# Patient Record
Sex: Female | Born: 1990 | Race: Black or African American | Hispanic: No | Smoking: Never smoker
Health system: Southern US, Community
[De-identification: ages and names within clinical notes are randomized; demographics above are authoritative.]

## PROBLEM LIST (undated history)

## (undated) ENCOUNTER — Inpatient Hospital Stay (HOSPITAL_COMMUNITY): Payer: Self-pay

## (undated) DIAGNOSIS — R519 Headache, unspecified: Secondary | ICD-10-CM

## (undated) DIAGNOSIS — Z789 Other specified health status: Secondary | ICD-10-CM

## (undated) DIAGNOSIS — D649 Anemia, unspecified: Secondary | ICD-10-CM

## (undated) HISTORY — PX: NO PAST SURGERIES: SHX2092

## (undated) HISTORY — DX: Other specified health status: Z78.9

## (undated) HISTORY — DX: Headache, unspecified: R51.9

---

## 2014-03-30 ENCOUNTER — Ambulatory Visit: Payer: Self-pay

## 2014-05-19 ENCOUNTER — Encounter: Payer: Self-pay | Admitting: Family Medicine

## 2014-05-19 ENCOUNTER — Ambulatory Visit (INDEPENDENT_AMBULATORY_CARE_PROVIDER_SITE_OTHER): Payer: Medicaid Other | Admitting: Family Medicine

## 2014-05-19 VITALS — BP 114/63 | HR 88 | Temp 98.6°F | Ht 66.0 in | Wt 194.5 lb

## 2014-05-19 DIAGNOSIS — Z008 Encounter for other general examination: Secondary | ICD-10-CM

## 2014-05-19 DIAGNOSIS — Z23 Encounter for immunization: Secondary | ICD-10-CM

## 2014-05-19 DIAGNOSIS — Z3049 Encounter for surveillance of other contraceptives: Secondary | ICD-10-CM

## 2014-05-19 DIAGNOSIS — M25512 Pain in left shoulder: Secondary | ICD-10-CM

## 2014-05-19 DIAGNOSIS — Z0289 Encounter for other administrative examinations: Secondary | ICD-10-CM

## 2014-05-19 MED ORDER — IBUPROFEN 200 MG PO TABS: 200.0000 mg | ORAL_TABLET | Freq: Four times a day (QID) | ORAL | Status: DC | PRN

## 2014-05-19 MED ORDER — CYCLOBENZAPRINE HCL 5 MG PO TABS: 5.0000 mg | ORAL_TABLET | Freq: Three times a day (TID) | ORAL | Status: DC | PRN

## 2014-05-19 NOTE — Patient Instructions (Signed)
Make an appt for a nurse visit for Depo shot in early January.  Come back in the summer and we will check on you.  Otherwise, you look very healthy.    It was good to see you today.

## 2014-05-21 ENCOUNTER — Encounter: Payer: Self-pay | Admitting: Family Medicine

## 2014-05-21 DIAGNOSIS — Z309 Encounter for contraceptive management, unspecified: Secondary | ICD-10-CM | POA: Insufficient documentation

## 2014-05-21 DIAGNOSIS — Z0289 Encounter for other administrative examinations: Secondary | ICD-10-CM | POA: Insufficient documentation

## 2014-05-21 DIAGNOSIS — M25512 Pain in left shoulder: Secondary | ICD-10-CM | POA: Insufficient documentation

## 2014-05-21 NOTE — Assessment & Plan Note (Signed)
Appears to be muscle spasm of Left neck/trapezius. OTC advil and flexeril PRN. FU if no improvement in 2 weeks, sooner if worsening.

## 2014-05-21 NOTE — Progress Notes (Signed)
[facility] Patient Visit  HPI:  Patient presents to [facility] today for a new patient appointment to establish general primary care.  She also wishes to discuss her shoulder pain.  States that her shoulder pain started about 2 weeks ago.    ROS: See HPI  Immigrant Social History: - Date arrived in [place]: Sept 2, 2015.  She is originally from [facility] [place] and fled to refugee camp in [place] age 23 in 2000.  Has lived there for past 14 years.  Met husband there, children born there.  - Language: Speaks English. Also speaks [place] and [place]. - Education: Highest level of education: no formal education - Tobacco/alcohol/drug use: denies - Marriage Status: married - Sexual activity: vaginal - Class A/B conditions: none - denies history of torture.   Past Medical Hx:  -[id number].  Otherwise negative medical history  Past Surgical Hx:  - denies  PHYSICAL EXAM: BP 114/63 mmHg  Pulse 88  Temp(Src) 98.6 F (37 C) (Oral)  Ht '5\' 6"'  (1.676 m)  Wt 194 lb 8 oz (88.225 kg)  BMI 31.41 kg/m2 BP 114/63 mmHg  Pulse 88  Temp(Src) 98.6 F (37 C) (Oral)  Ht '5\' 6"'  (1.676 m)  Wt 194 lb 8 oz (88.225 kg)  BMI 31.41 kg/m2 Gen: Well NAD HEENT: EOMI,  MMM Lungs: CTABL Nl WOB Heart: RRR no MRG Abd: NABS, NT, ND MSK:  Left shoulder with full AROM and PROM.  Tender to palpation along Left trapezius muscle, tight here as well.  No redness or swelling to shoulder.  Negative impingement testing.  Spurling negative.  Exts: Non edematous BL  LE, warm and well perfused.  Psych:  Not depressed or anxious appearing.  Linear and coherent thought process as evidenced by speech pattern. Smiles spontaneously.

## 2014-05-21 NOTE — Assessment & Plan Note (Signed)
Received Depo while in [place].  Due in Jan.  She will call and make an appt for this.

## 2014-05-27 ENCOUNTER — Encounter: Payer: Self-pay | Admitting: Family Medicine

## 2014-05-27 ENCOUNTER — Ambulatory Visit (INDEPENDENT_AMBULATORY_CARE_PROVIDER_SITE_OTHER): Payer: Medicaid Other | Admitting: Family Medicine

## 2014-05-27 VITALS — BP 109/72 | HR 124 | Temp 100.1°F | Wt 187.0 lb

## 2014-05-27 DIAGNOSIS — J029 Acute pharyngitis, unspecified: Secondary | ICD-10-CM | POA: Insufficient documentation

## 2014-05-27 MED ORDER — AMOXICILLIN 500 MG PO CAPS: 500.0000 mg | ORAL_CAPSULE | Freq: Three times a day (TID) | ORAL | Status: DC

## 2014-05-27 NOTE — Assessment & Plan Note (Signed)
Very likely strep with fever and adenopathy and age.  Will treat with antibiotics and nsaids

## 2014-05-27 NOTE — Patient Instructions (Signed)
Good to see you today!  Thanks for coming in.  You have a throat infection  Take the amoxicillin antibiotics three times daily until all gone  Use salt water gargles three times daily for your throat  Take ibuprofen 200 mg 2-3 tablets three times daily as needed for pain and fever  If you are feeling worse or can not drink fluids or are not better in one week come back

## 2014-05-27 NOTE — Progress Notes (Signed)
   Subjective:    Patient ID: Christine IvanElinda Hodge, female    DOB: 11/24/1990, 23 y.o.   MRN: [id number]  HPI URI  Has been sick for 1 days. Nasal discharge: mild Medications tried: tylenol Sick contacts: ?  Symptoms Fever: yes up to 103 yesterday Headache or face pain: headache no face pain Tooth pain: no Sneezing: no Scratchy throat: Sore throat painful to swallow, no drooling nonfocal Allergies: no Muscle aches: yes Severe fatigue Stiff neck: no Shortness of breath: no Rash: no Sore throat or swollen glands: Yes  Chief Complaint noted Review of Symptoms - see HPI PMH - Smoking status noted.   Vital Signs reviewed   Review of Systems     Objective:   Physical Exam  Tired but conversant and interactive Throat - red with bilateral swollen tonsils.  No trismus or drooling or uvula deviation.  No exudate.  Moderate bilateral anter cerv adennopathy Ears:  External ear exam shows no significant lesions or deformities.  Otoscopic examination reveals clear canals, tympanic membranes are intact bilaterally without bulging, retraction, inflammation or discharge. Hearing is grossly normal bilaterall Lungs:  Normal respiratory effort, chest expands symmetrically. Lungs are clear to auscultation, no crackles or wheezes. Heart - Regular rate and rhythm.  No murmurs, gallops or rubs.    Skin:  Intact without suspicious lesions or rashes Neck:    Supple with full range of motion without pain.       Assessment & Plan:

## 2014-06-09 ENCOUNTER — Ambulatory Visit: Payer: Medicaid Other

## 2014-08-12 ENCOUNTER — Emergency Department (HOSPITAL_COMMUNITY)
Admission: EM | Admit: 2014-08-12 | Discharge: 2014-08-12 | Disposition: A | Discharge status: Home / Self Care | Payer: Medicaid Other | Attending: Emergency Medicine | Admitting: Emergency Medicine

## 2014-08-12 ENCOUNTER — Emergency Department (HOSPITAL_COMMUNITY): Payer: Medicaid Other

## 2014-08-12 ENCOUNTER — Encounter (HOSPITAL_COMMUNITY): Payer: Self-pay | Admitting: Emergency Medicine

## 2014-08-12 DIAGNOSIS — M546 Pain in thoracic spine: Secondary | ICD-10-CM | POA: Diagnosis not present

## 2014-08-12 DIAGNOSIS — M549 Dorsalgia, unspecified: Secondary | ICD-10-CM

## 2014-08-12 DIAGNOSIS — Z792 Long term (current) use of antibiotics: Secondary | ICD-10-CM | POA: Diagnosis not present

## 2014-08-12 DIAGNOSIS — R0789 Other chest pain: Secondary | ICD-10-CM | POA: Insufficient documentation

## 2014-08-12 DIAGNOSIS — Z79899 Other long term (current) drug therapy: Secondary | ICD-10-CM | POA: Insufficient documentation

## 2014-08-12 DIAGNOSIS — R079 Chest pain, unspecified: Secondary | ICD-10-CM | POA: Diagnosis present

## 2014-08-12 LAB — CBC WITH DIFFERENTIAL/PLATELET
BASOS PCT: 0 % (ref 0–1)
Basophils Absolute: 0 10*3/uL (ref 0.0–0.1)
Eosinophils Absolute: 0.3 10*3/uL (ref 0.0–0.7)
Eosinophils Relative: 5 % (ref 0–5)
HEMATOCRIT: 38 % (ref 36.0–46.0)
HEMOGLOBIN: 12.5 g/dL (ref 12.0–15.0)
Lymphocytes Relative: 49 % — ABNORMAL HIGH (ref 12–46)
Lymphs Abs: 2.7 10*3/uL (ref 0.7–4.0)
MCH: 26.4 pg (ref 26.0–34.0)
MCHC: 32.9 g/dL (ref 30.0–36.0)
MCV: 80.2 fL (ref 78.0–100.0)
Monocytes Absolute: 0.3 10*3/uL (ref 0.1–1.0)
Monocytes Relative: 5 % (ref 3–12)
NEUTROS PCT: 41 % — AB (ref 43–77)
Neutro Abs: 2.3 10*3/uL (ref 1.7–7.7)
Platelets: 221 10*3/uL (ref 150–400)
RBC: 4.74 MIL/uL (ref 3.87–5.11)
RDW: 14.4 % (ref 11.5–15.5)
WBC: 5.5 10*3/uL (ref 4.0–10.5)

## 2014-08-12 LAB — BASIC METABOLIC PANEL
Anion gap: 6 (ref 5–15)
BUN: 10 mg/dL (ref 6–23)
CO2: 27 mmol/L (ref 19–32)
Calcium: 9.5 mg/dL (ref 8.4–10.5)
Chloride: 106 mmol/L (ref 96–112)
Creatinine, Ser: 0.55 mg/dL (ref 0.50–1.10)
GFR calc Af Amer: >90 mL/min (ref >90)
Glucose, Bld: 93 mg/dL (ref 70–99)
Potassium: 3.7 mmol/L (ref 3.5–5.1)
SODIUM: 139 mmol/L (ref 135–145)

## 2014-08-12 LAB — I-STAT BETA HCG BLOOD, ED (MC, WL, AP ONLY): I-stat hCG, quantitative: <5 m[IU]/mL (ref <5)

## 2014-08-12 LAB — I-STAT TROPONIN, ED: Troponin i, poc: 0 ng/mL (ref 0.00–0.08)

## 2014-08-12 MED ORDER — IBUPROFEN 800 MG PO TABS: 800.0000 mg | ORAL_TABLET | Freq: Three times a day (TID) | ORAL | Status: DC

## 2014-08-12 MED ORDER — METHOCARBAMOL 500 MG PO TABS
500.0000 mg | ORAL_TABLET | Freq: Once | ORAL | Status: DC
  Filled 2014-08-12: qty 1

## 2014-08-12 MED ORDER — METHOCARBAMOL 500 MG PO TABS: 500.0000 mg | ORAL_TABLET | Freq: Two times a day (BID) | ORAL | Status: DC

## 2014-08-12 MED ORDER — KETOROLAC TROMETHAMINE 30 MG/ML IJ SOLN
30.0000 mg | Freq: Once | INTRAMUSCULAR | Status: AC
  Administered 2014-08-12: 30 mg via INTRAMUSCULAR
  Filled 2014-08-12: qty 1

## 2014-08-12 NOTE — ED Provider Notes (Signed)
CSN: [id number]     Arrival date & time 08/12/14  1244 History   First MD Initiated Contact with Patient 08/12/14 1527     Chief Complaint  Patient presents with  . Chest Pain  . Back Pain     (Consider location/radiation/quality/duration/timing/severity/associated sxs/prior Treatment) HPI Comments: Patient presents today with a chief complaint of chest pain and back pain.  She states she fell down the stairs one month ago and hit her chest and her back.  However, she did not begin having pain until 2 weeks ago.  She states that she does have a one year old and a three year old child at home, which she does lift frequently.  Pain is worse with movement and also palpation.  Pain located left upper back and also left anterior chest and does not radiate.  She tried taking an OTC muscle rub cream for the pain, which gave her mild relief.  She denies SOB, cough, fever, dizziness, syncope, numbness, or tingling.  She reports that she had one episode of vomiting earlier today, but denies nausea or vomiting at this time.  She denies abdominal pain..  No history of DVT or PE.  No exogenous estrogen use.  No LE edema or pain.  No prolonged travel or surgeries in the past 4 weeks.  No prior cardiac history.  The history is provided by the patient.    History reviewed. No pertinent past medical history. History reviewed. No pertinent past surgical history. History reviewed. No pertinent family history. History  Substance Use Topics  . Smoking status: Never Smoker   . Smokeless tobacco: Not on file  . Alcohol Use: No   OB History    No data available     Review of Systems  All other systems reviewed and are negative.     Allergies  Review of patient's allergies indicates no known allergies.  Home Medications   Prior to Admission medications   Medication Sig Start Date End Date Taking? Authorizing Provider  amoxicillin (AMOXIL) 500 MG capsule Take 1 capsule (500 mg total) by mouth 3  (three) times daily. 05/27/14   Carney Living, MD  cyclobenzaprine (FLEXERIL) 5 MG tablet Take 1 tablet (5 mg total) by mouth 3 (three) times daily as needed for muscle spasms. 05/19/14   Tobey Grim, MD  ibuprofen (ADVIL) 200 MG tablet Take 1 tablet (200 mg total) by mouth every 6 (six) hours as needed. 05/19/14   Tobey Grim, MD   BP 134/78 mmHg  Pulse 75  Temp(Src) 98.3 F (36.8 C) (Oral)  Resp 18  SpO2 100% Physical Exam  Constitutional: She appears well-developed and well-nourished.  HENT:  Head: Normocephalic and atraumatic.  Mouth/Throat: Oropharynx is clear and moist.  Neck: Normal range of motion. Neck supple.  Cardiovascular: Normal rate, regular rhythm and normal heart sounds.   Pulses:      Dorsalis pedis pulses are 2+ on the right side, and 2+ on the left side.  Pulmonary/Chest: Effort normal and breath sounds normal. No respiratory distress. She has no wheezes. She has no rales. She exhibits tenderness.  Abdominal: Soft. Bowel sounds are normal. She exhibits no distension and no mass. There is no tenderness. There is no rebound and no guarding.  Musculoskeletal: Normal range of motion.  Pain of the chest when raising the left arm No LE erythema or edema bilaterally  Neurological: She is alert.  Skin: Skin is warm and dry.  Psychiatric: She has a normal mood  and affect.  Nursing note and vitals reviewed.   ED Course  Procedures (including critical care time) Labs Review Labs Reviewed  CBC WITH DIFFERENTIAL/PLATELET - Abnormal; Notable for the following:    Neutrophils Relative % 41 (*)    Lymphocytes Relative 49 (*)    All other components within normal limits  BASIC METABOLIC PANEL  I-STAT TROPOININ, ED  I-STAT BETA HCG BLOOD, ED ([facility], [facility], [facility] ONLY)    Imaging Review Dg Chest 2 View  08/12/2014   CLINICAL DATA:  Left upper chest pain starting yesterday, back pain  EXAM: CHEST  2 VIEW  COMPARISON:  None.  FINDINGS: Cardiomediastinal silhouette  is unremarkable. No acute infiltrate or pleural effusion. No pulmonary edema. Bony thorax is unremarkable.  IMPRESSION: No active cardiopulmonary disease.   Electronically Signed   By: Natasha MeadLiviu  Pop M.D.   On: 08/12/2014 16:24     EKG Interpretation   Date/Time:  Wednesday August 12 2014 13:14:53 EST Ventricular Rate:  75 PR Interval:  138 QRS Duration: 72 QT Interval:  384 QTC Calculation: 428 R Axis:   28 Text Interpretation:  Normal sinus rhythm Normal ECG No old tracing to  compare Confirmed by Mirian MoGentry, Matthew [phone] on 08/12/2014 4:37:13 PM      MDM   Final diagnoses:  None   Patient is a 24 year old female who presents today with a chief complaint of chest pain.  Chest pain has been present for the past 2 weeks.  Chest wall tender to palpation.   Chest pain also worse when raising her left arm.  Suspect that the pain is musculoskeletal.  She is PERC negative.  No ischemic changes on EKG.  Troponin negative.  CXR negative.   Pain improved after given Toradol.  Feel that the patient is stable for discharge.  She states that she has an appointment with PCP scheduled tomorrow.  Return precautions given.    Santiago GladHeather Morgane Joerger, PA-C 08/13/14 2200  Vanetta MuldersScott Zackowski, MD 08/20/14 0002

## 2014-08-12 NOTE — ED Notes (Signed)
States 1 mth ago she fell down the stairs.

## 2014-08-12 NOTE — Discharge Instructions (Signed)
Return to the Emergency Department if symptoms change or worsen.

## 2014-08-12 NOTE — ED Notes (Signed)
Pt c/o left sided CP, back pain and arm pain x 2 days worse with movement; pt denies obvious injury

## 2014-08-13 ENCOUNTER — Ambulatory Visit: Payer: Medicaid Other | Admitting: Family Medicine

## 2014-08-21 ENCOUNTER — Ambulatory Visit: Payer: Medicaid Other | Admitting: Family Medicine

## 2014-08-25 ENCOUNTER — Encounter: Payer: Self-pay | Admitting: Family Medicine

## 2014-08-25 ENCOUNTER — Ambulatory Visit (INDEPENDENT_AMBULATORY_CARE_PROVIDER_SITE_OTHER): Payer: Medicaid Other | Admitting: Family Medicine

## 2014-08-25 VITALS — BP 105/70 | HR 87 | Temp 97.7°F | Ht 66.0 in | Wt 187.3 lb

## 2014-08-25 DIAGNOSIS — Z309 Encounter for contraceptive management, unspecified: Secondary | ICD-10-CM

## 2014-08-25 DIAGNOSIS — M25512 Pain in left shoulder: Secondary | ICD-10-CM

## 2014-08-25 LAB — POCT URINE PREGNANCY: PREG TEST UR: NEGATIVE

## 2014-08-25 MED ORDER — MEDROXYPROGESTERONE ACETATE 150 MG/ML IM SUSP
150.0000 mg | Freq: Once | INTRAMUSCULAR | Status: AC
  Administered 2014-08-25: 150 mg via INTRAMUSCULAR

## 2014-08-25 MED ORDER — DICLOFENAC SODIUM 1 % TD GEL: 2.0000 g | Freq: Four times a day (QID) | TRANSDERMAL | Status: DC

## 2014-08-25 MED ORDER — TRAMADOL HCL 50 MG PO TABS: 50.0000 mg | ORAL_TABLET | Freq: Three times a day (TID) | ORAL | Status: DC | PRN

## 2014-08-25 NOTE — Patient Instructions (Signed)
Get the xrays at the hospital.  Ask for the radiology department.  You do NOT need an appointment.    You wll be called by sports medicine for your shoulder.  Pregnancy test and Depo injection today.  Take the Tramadol every 8 hours for pain.    Use the Voltaren cream on your shoulder.

## 2014-08-26 NOTE — Progress Notes (Signed)
Subjective:    Christine Hodge is a 24 y.o. female who [facility] for Left shoulder pain:  1.  Left shoulder pain:  Patient fell about 1 month ago.  Landed on her shoulder, NOT outstretched hand.  Has been seen for shoulder pain at [facility], did not recall fall.  States pain is worse when she lays on that side at night.  Worse when she tries to raise her arm above her head.  No bruising or swelling that she's noted.  Has not known what to try for relief.    ROS as above per HPI, otherwise neg.   No fevers or chills.  No numbness or tingling in arm.   The following portions of the patient's history were reviewed and updated as appropriate: allergies, current medications, past medical history, family and social history, and problem list. Patient is a nonsmoker.    PMH reviewed.  No past medical history on file. No past surgical history on file.  Medications reviewed. Current Outpatient Prescriptions  Medication Sig Dispense Refill  . cyclobenzaprine (FLEXERIL) 5 MG tablet Take 1 tablet (5 mg total) by mouth 3 (three) times daily as needed for muscle spasms. 30 tablet 1  . diclofenac sodium (VOLTAREN) 1 % GEL Apply 2 g topically 4 (four) times daily. 100 g 1  . ibuprofen (ADVIL,MOTRIN) 800 MG tablet Take 1 tablet (800 mg total) by mouth 3 (three) times daily. 21 tablet 0  . methocarbamol (ROBAXIN) 500 MG tablet Take 1 tablet (500 mg total) by mouth 2 (two) times daily. 20 tablet 0  . traMADol (ULTRAM) 50 MG tablet Take 1 tablet (50 mg total) by mouth every 8 (eight) hours as needed. 30 tablet 0   No current facility-administered medications for this visit.     Objective:   Physical Exam BP 105/70 mmHg  Pulse 87  Temp(Src) 97.7 F (36.5 C) (Oral)  Ht 5\' 6"  (1.676 m)  Wt 187 lb 4.8 oz (84.959 kg)  BMI 30.25 kg/m2  LMP 06/19/2014 Gen:  Alert, cooperative patient who appears stated age in no acute distress.  Vital signs reviewed. HEENT: EOMI,  MMM MSK:  Right shoulder WNL.    Left shoulder:  Normal appearing to inspection.  No bruising noted.  TTP at [facility] and [facility] joint.  I cannot tell, but it seems that there might be the slightest dislocation of [place] joint as this is more predominant than Right [place] joint.  Tender to palpation here.  Internal and external rotation limited by pain.  Can raise arm to about 90 degrees active ROM in both forward and lateral planes and has pain trying to lift any higher.  Better with passive ROM.  TTP along left acromion without stepoff.  She has tenderness and palpable muscle spasm along trapezius and paraspinal muscles to mid-thoracic region.   Results for orders placed or performed in visit on 08/25/14 (from the past 72 hour(s))  POCT urine pregnancy     Status: None   Collection Time: 08/25/14 12:20 PM  Result Value Ref Range   Preg Test, Ur Negative

## 2014-08-26 NOTE — Assessment & Plan Note (Signed)
Pregnancy test and Depo today.

## 2014-08-26 NOTE — Assessment & Plan Note (Signed)
This is different pain than prior in December as she has fallen since then.   I am worried about rotator cuff pathology -- but she also may have mild [place] anterior dislocation.   Sending for xrays of shoulder to assess.  Does not appear to have direct fracture of shoulder/humerus from exam. Sending to sports medicine for ultrasound.  She has Ibuprofen at home. Tramadol for severe pain relief.  Attempt voltaren gel.  Muscle relaxer.  Heat.  Range of motion exercises.  Will call with results of radiographs.

## 2014-08-27 ENCOUNTER — Ambulatory Visit
Admission: RE | Admit: 2014-08-27 | Discharge: 2014-08-27 | Disposition: A | Discharge status: Home / Self Care | Payer: Medicaid Other | Source: Ambulatory Visit | Attending: Family Medicine | Admitting: Family Medicine

## 2014-08-27 ENCOUNTER — Telehealth: Payer: Self-pay | Admitting: Family Medicine

## 2014-08-27 DIAGNOSIS — M25512 Pain in left shoulder: Secondary | ICD-10-CM

## 2014-08-27 NOTE — Telephone Encounter (Signed)
Please call patient and let her know that her xray is completely normal.  No broken bones.  She should be hearing from Sports medicine soon for follow-up.  I can answer any questions when I return.  She speaks [place].

## 2014-09-02 NOTE — Telephone Encounter (Signed)
Spoke with pt and informed her of below. Zimmerman Rumple, Kalif Kattner D  

## 2014-09-08 ENCOUNTER — Ambulatory Visit: Payer: Medicaid Other | Admitting: Sports Medicine

## 2014-11-11 ENCOUNTER — Ambulatory Visit (INDEPENDENT_AMBULATORY_CARE_PROVIDER_SITE_OTHER): Payer: Medicaid Other | Admitting: *Deleted

## 2014-11-11 ENCOUNTER — Ambulatory Visit: Payer: Medicaid Other

## 2014-11-11 DIAGNOSIS — Z3042 Encounter for surveillance of injectable contraceptive: Secondary | ICD-10-CM

## 2014-11-11 MED ORDER — MEDROXYPROGESTERONE ACETATE 150 MG/ML IM SUSP
150.0000 mg | Freq: Once | INTRAMUSCULAR | Status: AC
  Administered 2014-11-11: 150 mg via INTRAMUSCULAR

## 2014-11-11 NOTE — Progress Notes (Signed)
   Pt in for Depo Provera injection.  Pt tolerated Depo injection. Depo given left upper outer quadrant.  Next injection due August 24-Sept 7, 2016.  Reminder card given. Clovis PuMartin, Tamika L, RN

## 2014-11-18 ENCOUNTER — Ambulatory Visit: Payer: Medicaid Other | Admitting: Family Medicine

## 2014-11-25 ENCOUNTER — Other Ambulatory Visit (HOSPITAL_COMMUNITY)
Admission: RE | Admit: 2014-11-25 | Discharge: 2014-11-25 | Disposition: A | Discharge status: Home / Self Care | Payer: Medicaid Other | Source: Ambulatory Visit | Attending: Family Medicine | Admitting: Family Medicine

## 2014-11-25 ENCOUNTER — Encounter: Payer: Self-pay | Admitting: Family Medicine

## 2014-11-25 ENCOUNTER — Ambulatory Visit (INDEPENDENT_AMBULATORY_CARE_PROVIDER_SITE_OTHER): Payer: Medicaid Other | Admitting: Family Medicine

## 2014-11-25 VITALS — BP 111/69 | HR 78 | Temp 97.9°F | Wt 180.1 lb

## 2014-11-25 DIAGNOSIS — M25512 Pain in left shoulder: Secondary | ICD-10-CM

## 2014-11-25 DIAGNOSIS — B3731 Acute candidiasis of vulva and vagina: Secondary | ICD-10-CM

## 2014-11-25 DIAGNOSIS — M12512 Traumatic arthropathy, left shoulder: Secondary | ICD-10-CM | POA: Diagnosis present

## 2014-11-25 DIAGNOSIS — M75102 Unspecified rotator cuff tear or rupture of left shoulder, not specified as traumatic: Principal | ICD-10-CM

## 2014-11-25 DIAGNOSIS — B373 Candidiasis of vulva and vagina: Secondary | ICD-10-CM

## 2014-11-25 DIAGNOSIS — Z202 Contact with and (suspected) exposure to infections with a predominantly sexual mode of transmission: Secondary | ICD-10-CM | POA: Diagnosis not present

## 2014-11-25 DIAGNOSIS — M12812 Other specific arthropathies, not elsewhere classified, left shoulder: Secondary | ICD-10-CM

## 2014-11-25 DIAGNOSIS — Z113 Encounter for screening for infections with a predominantly sexual mode of transmission: Secondary | ICD-10-CM | POA: Insufficient documentation

## 2014-11-25 MED ORDER — FLUCONAZOLE 150 MG PO TABS: 150.0000 mg | ORAL_TABLET | Freq: Once | ORAL | Status: DC

## 2014-11-25 MED ORDER — TRAMADOL HCL 50 MG PO TABS: 50.0000 mg | ORAL_TABLET | Freq: Three times a day (TID) | ORAL | Status: DC | PRN

## 2014-11-25 MED ORDER — MELOXICAM 15 MG PO TABS: ORAL_TABLET | ORAL | Status: DC

## 2014-11-25 MED ORDER — DICLOFENAC SODIUM 1 % TD GEL: 2.0000 g | Freq: Four times a day (QID) | TRANSDERMAL | Status: DC

## 2014-11-25 NOTE — Patient Instructions (Addendum)
Thank you for coming in, today!  I think you have some inflammation in the muscles that control your shoulder joint. These muscles are called the "rotator cuff." I want you to take a medication called meloxicam, every day for about 2 weeks. Take it with food. DO NOT take ibuprofen, Advil, Motrin, Aleve, etc with meloxicam. You CAN take Tylenol with it. You can also take tramadol for pain, up to 3 times per day -- this is the same pain medication you had, before. I will show you some exercises to do for your shoulder. Start with once per day and work your way up to about 3 times per day.  I do think you have a yeast infection. This is NOT sexually transmitted. I want you to take a medication called fluconazole, one pill one time. You will get 2 pills. You can take the second pill in 1 week if you need.  I will test you for HIV, syphilis, gonorrhea, and Chlamydia, today (these are all sexually-transmitted infections). If anything is positive, someone will call you and tell you what you need to do. If everything is negative, I will write you a letter.  Come back to see Dr. Gwendolyn Grant as you need. If your shoulder pain is no better or if it gets worse, come back sometime in the next 4 weeks.  Please feel free to call with any questions or concerns at any time, at [phone]. --Dr. Casper Harrison

## 2014-11-25 NOTE — Progress Notes (Signed)
Subjective:    Patient ID: Christine Hodge, female    DOB: September 02, 1990, 24 y.o.   MRN: [id number]  Visit conducted in Portugese. In-person interpretation provided by Mathis Fare (Language Resources).  HPI: Pt presents to clinic for left shoulder / arm / hand pain for about 2 months. She fell on her shoulder at home (down the stairs) and her pain has been variable, sometimes worse than others. Pain is described as worse with some movements at times (raising arm), but not always. She was seen for this back in March and was prescribed Voltaren gel and Tramadol, which helped. She was referred to sports medicine but was unable to get an appointment, there. She denies frank paresthesias or weakness in her arm or hand.  Of note, pt does request HIV testing and STI's. She states this is because she has not had it checked since about 5 months ago; she is currently sexually active but has no symptoms currently and has never had any STI's in the past. She did have some vaginal itching / discomfort about 2-3 weeks ago, but it improved on its own.  Review of Systems: As above.     Objective:   Physical Exam BP 111/69 mmHg  Pulse 78  Temp(Src) 97.9 F (36.6 C)  Wt 180 lb 1.6 oz (81.693 kg) Gen: well-appearing adult female in NAD HEENT: [place]/AT, EOMI, PERRLA, MMM Cardio: RRR, no murmur appreciated Pulm: CTAB, no wheezes, normal WOB Abd: soft, nontender, BS+ Ext: warm, well-perfused, no LE edema MSK: bilateral shoulders normal to inspection  Diffuse soft-tissue tenderness of left shoulder, especially over supraspinatus area  Minimal tenderness in anterior shoulder over bony prominences  ROM full in both shoulders, though slightly slowed on the left secondary to pain  Painful arch with flexion / abduction of left shoulder starting at about 90 degrees of flexion and about 100 degrees of abduction  Increased pain with active resisted internal and external rotation at the left shoulder, external  slightly more than internal  Positive empty can test and Neer's test for impingement on the left Neurovascular: alert, oriented, no gross sensory deficit  Strength 5/5 in both upper extremities, though with increased pain as above in specific movements as noted  Distal pulses in UE intact, bilaterally  GU: normal external vaginal / vulvar structures, no rashes / ulcers / other lesions Speculum: moderate amount of thick, whitish discharge noted without odor; no cervical friability or bleeding noted     Assessment & Plan:  24yo female with likely left rotator cuff pathology as well as concern for STI exposure and exam consistent with current yeast vaginitis  1. Left rotator cuff pathology - Rx for Mobic daily for 2 weeks, then daily PRN (advised taking with food and avoiding other NSAIDs) - Rx's refilled for tramadol and Voltaren gel for pain, otherwise - provided handout on rotator cuff ROM and strengthening exercises and demonstrated; provided Theraband for exercises - advised f/u with Dr. Gwendolyn Grant in 4-6 weeks if no improvement; could consider re-referral to sports medicine, as well  2. STI exposure - no active symptoms other than some itching / burning consistent with yeast (see below) - sample collected for GC / Chlamydia today - blood drawn for HIV and RPR, as well - counseled on safe sex practices - advised f/u as needed; specific recommendations pending test results, otherwise  3. Yeast vaginitis - typical history and exam findings - Rx for Diflucan 150 mg one time to be repeated in 1 week if needed -  f/u as needed, otherwise  Bobbye Morton, MD PGY-3, [facility] 11/25/2014, 12:10 PM

## 2014-11-26 LAB — CERVICOVAGINAL ANCILLARY ONLY
Chlamydia: NEGATIVE
Neisseria Gonorrhea: NEGATIVE

## 2014-11-26 LAB — RPR

## 2014-11-26 LAB — HIV ANTIBODY (ROUTINE TESTING W REFLEX): HIV 1&2 Ab, 4th Generation: NONREACTIVE

## 2014-11-27 ENCOUNTER — Encounter: Payer: Self-pay | Admitting: Family Medicine

## 2015-01-05 ENCOUNTER — Telehealth: Payer: Self-pay | Admitting: Family Medicine

## 2015-01-05 NOTE — Telephone Encounter (Signed)
Would like to talk to dr-has some questions. Had some blood after having sex. Not her period Please advise

## 2015-01-08 ENCOUNTER — Ambulatory Visit: Payer: Medicaid Other | Admitting: Family Medicine

## 2015-01-08 NOTE — Telephone Encounter (Signed)
Attempted to call.  No answer and no machine.  Draco Malczewski Dawn, CMA  

## 2015-03-02 ENCOUNTER — Ambulatory Visit (INDEPENDENT_AMBULATORY_CARE_PROVIDER_SITE_OTHER): Payer: BLUE CROSS/BLUE SHIELD | Admitting: Family Medicine

## 2015-03-02 ENCOUNTER — Other Ambulatory Visit (HOSPITAL_COMMUNITY)
Admission: RE | Admit: 2015-03-02 | Discharge: 2015-03-02 | Disposition: A | Discharge status: Home / Self Care | Payer: BLUE CROSS/BLUE SHIELD | Source: Ambulatory Visit | Attending: Family Medicine | Admitting: Family Medicine

## 2015-03-02 VITALS — BP 104/70 | HR 74 | Temp 97.6°F | Wt 160.0 lb

## 2015-03-02 DIAGNOSIS — M25512 Pain in left shoulder: Secondary | ICD-10-CM | POA: Diagnosis not present

## 2015-03-02 DIAGNOSIS — M7502 Adhesive capsulitis of left shoulder: Secondary | ICD-10-CM

## 2015-03-02 DIAGNOSIS — N898 Other specified noninflammatory disorders of vagina: Secondary | ICD-10-CM | POA: Diagnosis not present

## 2015-03-02 DIAGNOSIS — Z113 Encounter for screening for infections with a predominantly sexual mode of transmission: Secondary | ICD-10-CM | POA: Insufficient documentation

## 2015-03-02 LAB — POCT WET PREP (WET MOUNT): Clue Cells Wet Prep Whiff POC: NEGATIVE

## 2015-03-02 MED ORDER — MELOXICAM 15 MG PO TABS
ORAL_TABLET | ORAL | Status: DC
Start: 2015-03-02 — End: 2017-01-01

## 2015-03-02 MED ORDER — DICLOFENAC SODIUM 1 % TD GEL: 2.0000 g | Freq: Four times a day (QID) | TRANSDERMAL | Status: DC

## 2015-03-02 NOTE — Patient Instructions (Addendum)
Please follow up with Dr Gwendolyn Grant as needed.  I have placed a referral for Sports Medicine for your shoulder for you.  Your prescriptions for Mobic and Voltaren gel have also been renewed.  Continue to use these medications until your are seen at the Sports Medicine clinic.  You have also been provided a work note through Monday 03/08/15.  Once your lab tests come back, we will contact you with results.  Dr Nadine Counts  Rotator Cuff Injury Rotator cuff injury is any type of injury to the set of muscles and tendons that make up the stabilizing unit of your shoulder. This unit holds the ball of your upper arm bone (humerus) in the socket of your shoulder blade (scapula).  CAUSES Injuries to your rotator cuff most commonly come from sports or activities that cause your arm to be moved repeatedly over your head. Examples of this include throwing, weight lifting, swimming, or racquet sports. Long lasting (chronic) irritation of your rotator cuff can cause soreness and swelling (inflammation), bursitis, and eventual damage to your tendons, such as a tear (rupture). SIGNS AND SYMPTOMS Acute rotator cuff tear:  Sudden tearing sensation followed by severe pain shooting from your upper shoulder down your arm toward your elbow.  Decreased range of motion of your shoulder because of pain and muscle spasm.  Severe pain.  Inability to raise your arm out to the side because of pain and loss of muscle power (large tears). Chronic rotator cuff tear:  Pain that usually is worse at night and may interfere with sleep.  Gradual weakness and decreased shoulder motion as the pain worsens.  Decreased range of motion. Rotator cuff tendinitis:  Deep ache in your shoulder and the outside upper arm over your shoulder.  Pain that comes on gradually and becomes worse when lifting your arm to the side or turning it inward. DIAGNOSIS Rotator cuff injury is diagnosed through a medical history, physical exam, and imaging  exam. The medical history helps determine the type of rotator cuff injury. Your health care provider will look at your injured shoulder, feel the injured area, and ask you to move your shoulder in different positions. X-ray exams typically are done to rule out other causes of shoulder pain, such as fractures. MRI is the exam of choice for the most severe shoulder injuries because the images show muscles and tendons.  TREATMENT  Chronic tear:  Medicine for pain, such as acetaminophen or ibuprofen.  Physical therapy and range-of-motion exercises may be helpful in maintaining shoulder function and strength.  Steroid injections into your shoulder joint.  Surgical repair of the rotator cuff if the injury does not heal with noninvasive treatment. Acute tear:  Anti-inflammatory medicines such as ibuprofen and naproxen to help reduce pain and swelling.  A sling to help support your arm and rest your rotator cuff muscles. Long-term use of a sling is not advised. It may cause significant stiffening of the shoulder joint.  Surgery may be considered within a few weeks, especially in younger, active people, to return the shoulder to full function.  Indications for surgical treatment include the following:  Age younger than 60 years.  Rotator cuff tears that are complete.  Physical therapy, rest, and anti-inflammatory medicines have been used for 6-8 weeks, with no improvement.  Employment or sporting activity that requires constant shoulder use. Tendinitis:  Anti-inflammatory medicines such as ibuprofen and naproxen to help reduce pain and swelling.  A sling to help support your arm and rest your rotator cuff  muscles. Long-term use of a sling is not advised. It may cause significant stiffening of the shoulder joint.  Severe tendinitis may require:  Steroid injections into your shoulder joint.  Physical therapy.  Surgery. HOME CARE INSTRUCTIONS   Apply ice to your injury:  Put ice in a  plastic bag.  Place a towel between your skin and the bag.  Leave the ice on for 20 minutes, 2-3 times a day.  If you have a shoulder immobilizer (sling and straps), wear it until told otherwise by your health care provider.  You may want to sleep on several pillows or in a recliner at night to lessen swelling and pain.  Only take over-the-counter or prescription medicines for pain, discomfort, or fever as directed by your health care provider.  Do simple hand squeezing exercises with a soft rubber ball to decrease hand swelling. SEEK MEDICAL CARE IF:   Your shoulder pain increases, or new pain or numbness develops in your arm, hand, or fingers.  Your hand or fingers are colder than your other hand. SEEK IMMEDIATE MEDICAL CARE IF:   Your arm, hand, or fingers are numb or tingling.  Your arm, hand, or fingers are increasingly swollen and painful, or they turn white or blue. MAKE SURE YOU:  Understand these instructions.  Will watch your condition.  Will get help right away if you are not doing well or get worse. Document Released: 05/19/2000 Document Revised: 05/27/2013 Document Reviewed: 01/01/2013 [facility] Patient Information 2015 [place], [place]. This information is not intended to replace advice given to you by your health care provider. Make sure you discuss any questions you have with your health care provider.

## 2015-03-02 NOTE — Progress Notes (Signed)
Subjective: CC: Arm pain HPI: Patient is a 24 y.o. female presenting to clinic today for same day appointment. Concerns today include:  1. Arm pain Patient reports that she developed L arm pain that flared up yesterday.  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  She does not feel that she can perform her job efficiently 2/2 her L arm.  She reports that she has been using Meloxicam for pain, last dose today.  Has been applying Voltaren gel with No improvement.  She has been referred to Sports Medicine before but missed her appointment.  Denies overt weakness but states that pain limits her ability to hold onto things.  Pain radiates from shoulder to hand.  8/10 currently.  2. Vaginal concern Patient reports that she has had vaginal itching for 1 day.  She reports that last yeast infection was about 1 month ago.  She reports white vaginal discharge.  Patient sexually active with 1 female partner.  She reports that she uses condoms now bc depo has expired.  Though does not routinely use them.  Denies fevers, chills, abdominal pain, nausea or vomiting.    Social History Reviewed: non smoker. FamHx and MedHx updated.  Please see EMR. Health Maintenance: Flu shot due, Depo Due  ROS: All other systems reviewed and are negative.  Objective: Office vital signs reviewed. BP 104/70 mmHg  Pulse 74  Temp(Src) 97.6 F (36.4 C) (Oral)  Wt 160 lb (72.576 kg)  Physical Examination:  General: Awake, alert, well nourished, NAD HEENT: Normal, MMM GI: soft, NT/ND,+BS x4, no palpable masses GU: external vaginal tissue normal, cervical os anteriorly positioned, mild leukorrhea, no visible punctate lesions on os, no cervical motion TTP Extremities: WWP, No edema, cyanosis or clubbing; +2 pulses bilaterally MSK: Normal gait and station, L shoulder with TTP along scapula, distal clavicle and entire rotator cuff, no scapular winging apparent, pain with all ROM and strength tests, both AROM and PROM  extremely limited in abduction and extension of L shoulder, moderately limited in flexion. Skin: dry, intact, no rashes or lesions Neuro: Strength and sensation grossly intact, strength UE 5/5 but limited by pain  Results for orders placed or performed in visit on 03/02/15 (from the past 72 hour(s))  POCT Wet Prep Mellody Drown Coin)     Status: Abnormal   Collection Time: 03/02/15 11:42 AM  Result Value Ref Range   Source Wet Prep POC VAG    WBC, Wet Prep HPF POC 10-15    Bacteria Wet Prep HPF POC Moderate (A) None, Few   Clue Cells Wet Prep HPF POC None None   Clue Cells Wet Prep Whiff POC Negative Whiff    Yeast Wet Prep HPF POC None    Trichomonas Wet Prep HPF POC NONE    Assessment/ Plan: 24 y.o. female with  1. Left shoulder pain, suspect adhesive capsulitis. - meloxicam (MOBIC) 15 MG tablet; Take one pill every day with food for 2 weeks. Then take one pill daily as needed.  Dispense: 30 tablet; Refill: 1 - diclofenac sodium (VOLTAREN) 1 % GEL; Apply 2 g topically 4 (four) times daily.  Dispense: 100 g; Refill: 1 - Ambulatory referral to Sports Medicine - Exercises for adhesive capsulitis reviewed and given to patient. - Work note also provided through [facility] 10/3.  2. Vaginal discharge.  Patient concerned about potential STI.   - HIV antibody (with reflex) - RPR - Cervicovaginal ancillary only: GC/ CT sent for cx. - POCT Wet Prep (  [employer]). No evidence of infection.  Suspect that patient is having vaginal irritation associated with condom use.  Advised to choose barrier method without fragrance, added chemicals. - POCT urine pregnancy (patient used restroom but did not leave sample, so order cancelled) - Patient to follow up with PCP if symptoms do not improve.    Raliegh Ip, DO PGY-2, [facility]

## 2015-03-03 ENCOUNTER — Telehealth: Payer: Self-pay | Admitting: Family Medicine

## 2015-03-03 ENCOUNTER — Encounter: Payer: Self-pay | Admitting: Family Medicine

## 2015-03-03 LAB — HIV ANTIBODY (ROUTINE TESTING W REFLEX): HIV 1&2 Ab, 4th Generation: NONREACTIVE

## 2015-03-03 LAB — CERVICOVAGINAL ANCILLARY ONLY
Chlamydia: NEGATIVE
NEISSERIA GONORRHEA: NEGATIVE

## 2015-03-03 LAB — RPR

## 2015-03-03 NOTE — Telephone Encounter (Signed)
Called LM for pt to call our office. Sunday Spillers, CMA

## 2015-03-03 NOTE — Telephone Encounter (Signed)
Attempted to call patient re: NEGATIVE HIV and NEGATIVE RPR.  Phone went straight to voicemail.  In addition, wet prep negative yesterday.  No evidence of yeast infection.    Ashly M. Nadine Counts, DO PGY-2, [facility]

## 2015-03-09 ENCOUNTER — Telehealth: Payer: Self-pay | Admitting: Family Medicine

## 2015-03-09 NOTE — Telephone Encounter (Signed)
LM for pt to call.  Forms left up front ready for pick up. Sunday Spillers, CMA

## 2015-03-09 NOTE — Telephone Encounter (Signed)
Christine Hodge and Christine Hodge have the form and will give it to Dr. Nadine Counts this afternoon when she is in clinic. Sunday Spillers, CMA

## 2015-03-09 NOTE — Telephone Encounter (Signed)
Unfortunately, this will not be ready until tomorrow.  Please advise patient that paperwork requires 24 hours to complete.  This is clinic policy.  This will be ready first thing in the morning.  Also, please tell patient that the note was written so that she COULD resume work today.  She was covered to be out through 10/3, with intention that she could go back to work 10/4.

## 2015-03-09 NOTE — Telephone Encounter (Signed)
Patient dropped off form to be filled out so she can go back to work.  She is needing this today along with a new letter with todays date on it.  Please call her as soon as this is ready so she can work today.

## 2015-03-11 NOTE — Telephone Encounter (Signed)
Letter created and mailed. Ervey Fallin, Maryjo Rochester

## 2015-03-15 ENCOUNTER — Ambulatory Visit: Payer: Medicaid Other | Admitting: Sports Medicine

## 2015-07-02 ENCOUNTER — Ambulatory Visit: Payer: BLUE CROSS/BLUE SHIELD | Admitting: Family Medicine

## 2015-07-13 ENCOUNTER — Ambulatory Visit: Payer: BLUE CROSS/BLUE SHIELD | Admitting: Family Medicine

## 2015-07-30 ENCOUNTER — Other Ambulatory Visit (HOSPITAL_COMMUNITY)
Admission: RE | Admit: 2015-07-30 | Discharge: 2015-07-30 | Disposition: A | Discharge status: Home / Self Care | Payer: BLUE CROSS/BLUE SHIELD | Source: Ambulatory Visit | Attending: Family Medicine | Admitting: Family Medicine

## 2015-07-30 ENCOUNTER — Ambulatory Visit (INDEPENDENT_AMBULATORY_CARE_PROVIDER_SITE_OTHER): Payer: BLUE CROSS/BLUE SHIELD | Admitting: Family Medicine

## 2015-07-30 ENCOUNTER — Encounter: Payer: Self-pay | Admitting: Family Medicine

## 2015-07-30 VITALS — BP 98/55 | HR 80 | Temp 97.8°F | Ht 66.0 in | Wt 173.8 lb

## 2015-07-30 DIAGNOSIS — Z01411 Encounter for gynecological examination (general) (routine) with abnormal findings: Secondary | ICD-10-CM | POA: Insufficient documentation

## 2015-07-30 DIAGNOSIS — L819 Disorder of pigmentation, unspecified: Secondary | ICD-10-CM | POA: Diagnosis not present

## 2015-07-30 DIAGNOSIS — Z1151 Encounter for screening for human papillomavirus (HPV): Secondary | ICD-10-CM | POA: Insufficient documentation

## 2015-07-30 DIAGNOSIS — Z124 Encounter for screening for malignant neoplasm of cervix: Secondary | ICD-10-CM

## 2015-07-30 DIAGNOSIS — M25512 Pain in left shoulder: Secondary | ICD-10-CM | POA: Diagnosis not present

## 2015-07-30 NOTE — Patient Instructions (Signed)
Do the shoulder exercises as we discussed.  We will let you know the results of the pap smear.  It was good to see you today

## 2015-07-30 NOTE — Progress Notes (Signed)
Subjective:    Christine Hodge is a 25 y.o. female who presents to [facility] today for concerns for spots on her breast and Left shoulder pain:  1.  Left shoulder pain:  Persists.  Worse when moving her arm above her head.  Is continuing to use Voltaren cream with  Inconsistent relief.  She fell and landed on her shoulder several months back, has had pain since that time.  No weakness. No paresthesias in arms or hands. Worse anterior chest.  #2. Spots on her breast: She is concerned because she hasn't noticed minor areas on her skin across the tops of both of her breasts. Present for several weeks. No pain. No itching. She puts moisturizing cream on her body but otherwise does not use any other creams.   Prev health:  Also needs Pap smear today.  ROS as above per HPI, otherwise neg.    The following portions of the patient's history were reviewed and updated as appropriate: allergies, current medications, past medical history, family and social history, and problem list. Patient is a nonsmoker.    PMH reviewed.  No past medical history on file. No past surgical history on file.  Medications reviewed. Current Outpatient Prescriptions  Medication Sig Dispense Refill  . diclofenac sodium (VOLTAREN) 1 % GEL Apply 2 g topically 4 (four) times daily. 100 g 1  . fluconazole (DIFLUCAN) 150 MG tablet Take 1 tablet (150 mg total) by mouth once. Repeat in 1 week if needed. 2 tablet 0  . meloxicam (MOBIC) 15 MG tablet Take one pill every day with food for 2 weeks. Then take one pill daily as needed. 30 tablet 1  . traMADol (ULTRAM) 50 MG tablet Take 1 tablet (50 mg total) by mouth every 8 (eight) hours as needed. 50 tablet 1   No current facility-administered medications for this visit.     Objective:   Physical Exam BP 98/55 mmHg  Pulse 80  Temp(Src) 97.8 F (36.6 C) (Oral)  Ht  (1.676 m)  Wt 173 lb 12.8 oz (78.835 kg)  BMI 28.07 kg/m2 Gen:  Alert, cooperative patient who appears  stated age in no acute distress.  Vital signs reviewed. HEENT: EOMI,  MMM Cardiac:  Regular rate and rhythm without murmur auscultated.  Good S1/S2. Pulm:  Clear to auscultation bilaterally with good air movement.  No wheezes or rales noted.   Skin:  Scattered hypopigmented areas 3 in total across upper breasts. None larger than 1 cm in diameter. These are actually fairly her to see even against her darker skin. No pain or tenderness. MSK:  TTP along length of acromion on Left.  Also with what appears to be anterior translocation of [place] joint.  TTP here.  Decreased extrenal rotation secondary to pain.  No point tenderness over shoulder joint itself or AC joint.   GYN:  External genitalia within normal limits.  Vaginal mucosa pink, moist, normal rugae.  Nonfriable cervix without lesions, no discharge or bleeding noted on speculum exam.  Pap smear done today.      No results found for this or any previous visit (from the past 72 hour(s)).

## 2015-07-30 NOTE — Assessment & Plan Note (Signed)
Very mild. Difficult to ascertain on exam. No clear etiology for this. Provided reassurance. Follow-up if worsening or she notices other areas

## 2015-07-30 NOTE — Assessment & Plan Note (Signed)
Questionable mild anterior dislocation of [place] joint. However this might just be normal variant. This was present on last exam almost 1-1/2 years ago. Range of motion exercises given again today. Continue ibuprofen for pain relief.  She requests sports medicine referral, unable to keep last appt, will place today.

## 2015-08-02 LAB — CYTOLOGY - PAP

## 2015-08-13 ENCOUNTER — Telehealth: Payer: Self-pay | Admitting: *Deleted

## 2015-08-13 NOTE — Addendum Note (Signed)
Addended byGwendolyn Grant: Harve Spradley, Newt LukesJEFFREY H on: 08/13/2015 04:39 PM   Modules accepted: Kipp BroodSmartSet

## 2015-08-13 NOTE — Telephone Encounter (Signed)
-----   Message from [name], MD sent at 08/13/2015  4:40 PM EST ----- Abnormal Pap smear -- she needs to have colposcopy.  Can you schedule her for next colposcopy clinic, and then I will call her to give the results and the date of her appt?  Thanks!  JW

## 2015-08-13 NOTE — Telephone Encounter (Signed)
[facility]  Forwarding to PCP so he can notify pt of appointment when he contacts her with the results. Lamonte SakaiZimmerman Rumple, April D, [place]

## 2015-08-18 NOTE — Telephone Encounter (Signed)
[employer] [phone] Tobi Bastosnna used for encounter.  Had to leave phone message through interpreter.  Of note, told her that we received results of Pap smear and that she would need a follow-up appointment.  Provided date and time of colposcopy but unable to leave details about what that visit will entail.  Will try calling back tomorrow.

## 2015-08-19 NOTE — Telephone Encounter (Signed)
Unable to get through to patient.  Will write a letter informing her of abnormal test results and her upcoming appointment.  Routed letter to [employer].

## 2015-08-20 ENCOUNTER — Ambulatory Visit: Payer: BLUE CROSS/BLUE SHIELD | Admitting: Family Medicine

## 2015-09-09 ENCOUNTER — Ambulatory Visit (INDEPENDENT_AMBULATORY_CARE_PROVIDER_SITE_OTHER): Payer: BLUE CROSS/BLUE SHIELD | Admitting: Family Medicine

## 2015-09-09 VITALS — BP 123/74 | HR 82 | Temp 98.2°F | Ht 66.0 in | Wt 177.8 lb

## 2015-09-09 DIAGNOSIS — R896 Abnormal cytological findings in specimens from other organs, systems and tissues: Secondary | ICD-10-CM | POA: Diagnosis not present

## 2015-09-09 DIAGNOSIS — IMO0002 Reserved for concepts with insufficient information to code with codable children: Secondary | ICD-10-CM

## 2015-09-09 DIAGNOSIS — Z3042 Encounter for surveillance of injectable contraceptive: Secondary | ICD-10-CM | POA: Diagnosis not present

## 2015-09-09 LAB — POCT URINE PREGNANCY: PREG TEST UR: NEGATIVE

## 2015-09-09 MED ORDER — MEDROXYPROGESTERONE ACETATE 150 MG/ML IM SUSP
150.0000 mg | Freq: Once | INTRAMUSCULAR | Status: AC
  Administered 2015-09-09: 150 mg via INTRAMUSCULAR

## 2015-09-09 NOTE — Progress Notes (Signed)
   Pt in for Depo Provera injection.  Pregnancy test ordered; result negative. Pt tolerated Depo injection. Depo given left upper outer quadrant.  Next injection due June 22-December 09, 2015.  Reminder card given. Clovis PuMartin, Tamika L, RN

## 2015-09-09 NOTE — Patient Instructions (Signed)
As we discussed, I would recommend you come back for a repeat Pap smear in February 2018. This is the currently recommended follow-up for a woman your age, 89, who has this mild abnormality on your Pap smear. If her Pap smear is abnormal again next year, most likely we would do the same thing and wait another 12 months to repeat it. If on the third Pap smear you still have this abnormality, then I would recommend a colposcopy.  We will restart her Depo-Provera today.

## 2015-09-09 NOTE — Progress Notes (Signed)
Patient ID: Christine IvanElinda Minniear, female   DOB: [birth year], 25 y.o.   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 She was scheduled by her PCP for colposcopy.  I reviewed follow-up options with her. Per ASCCP guidelines follow-up for this abnormality in a patient who is 25 is repeat cytology at one year. I discussed this with her at length and answered all her questions. I gave her the option of going ahead and performing the colposcopy since she was here for that but she opted instead after our discussion to have repeat cytology done next February. Greater than 50% of our 25 minute office visit was spent in counseling and education regarding these issues. She also wishes to restart Depo-Provera for contraception. She had a negative pregnancy test today so we started that back.

## 2016-10-18 LAB — OB RESULTS CONSOLE VARICELLA ZOSTER ANTIBODY, IGG: Varicella: IMMUNE

## 2016-10-18 LAB — OB RESULTS CONSOLE HGB/HCT, BLOOD
HEMATOCRIT: 31
HEMOGLOBIN: 9.9

## 2016-10-18 LAB — OB RESULTS CONSOLE ANTIBODY SCREEN: ANTIBODY SCREEN: NEGATIVE

## 2016-10-18 LAB — OB RESULTS CONSOLE ABO/RH: RH TYPE: POSITIVE

## 2016-10-18 LAB — OB RESULTS CONSOLE RUBELLA ANTIBODY, IGM: Rubella: IMMUNE

## 2016-10-18 LAB — OB RESULTS CONSOLE GC/CHLAMYDIA
Chlamydia: NEGATIVE
GC PROBE AMP, GENITAL: NEGATIVE

## 2016-10-18 LAB — OB RESULTS CONSOLE PLATELET COUNT: PLATELETS: 244

## 2016-10-18 LAB — OB RESULTS CONSOLE HEPATITIS B SURFACE ANTIGEN: HEP B S AG: NEGATIVE

## 2016-10-18 LAB — OB RESULTS CONSOLE HIV ANTIBODY (ROUTINE TESTING): HIV: NONREACTIVE

## 2016-10-18 LAB — OB RESULTS CONSOLE RPR: RPR: NONREACTIVE

## 2017-01-01 ENCOUNTER — Other Ambulatory Visit (HOSPITAL_COMMUNITY)
Admission: RE | Admit: 2017-01-01 | Discharge: 2017-01-01 | Disposition: A | Discharge status: Home / Self Care | Payer: Medicaid Other | Source: Ambulatory Visit | Attending: Certified Nurse Midwife | Admitting: Certified Nurse Midwife

## 2017-01-01 ENCOUNTER — Ambulatory Visit (INDEPENDENT_AMBULATORY_CARE_PROVIDER_SITE_OTHER): Payer: Medicaid Other | Admitting: Certified Nurse Midwife

## 2017-01-01 ENCOUNTER — Encounter: Payer: Self-pay | Admitting: Certified Nurse Midwife

## 2017-01-01 VITALS — BP 99/66 | HR 100 | Wt 189.1 lb

## 2017-01-01 DIAGNOSIS — Z3482 Encounter for supervision of other normal pregnancy, second trimester: Secondary | ICD-10-CM

## 2017-01-01 DIAGNOSIS — Z3480 Encounter for supervision of other normal pregnancy, unspecified trimester: Secondary | ICD-10-CM | POA: Diagnosis not present

## 2017-01-01 MED ORDER — PRENATE PIXIE 10-0.6-0.4-200 MG PO CAPS: 1.0000 | ORAL_CAPSULE | Freq: Every day | ORAL | 12 refills | Status: DC

## 2017-01-01 NOTE — Progress Notes (Signed)
Patient is in the office for initial ob visit, reports feeling fetal movement, denies pain.

## 2017-01-01 NOTE — Progress Notes (Addendum)
Subjective:    Christine Hodge is being seen today for her first obstetrical visit.  This is a planned pregnancy. She is at 8375w5d gestation. Her obstetrical history is significant for none. Relationship with FOB: significant other, living together. Patient does intend to breast feed. Pregnancy history fully reviewed.  Works as a Glass blower/designerpacker.  The information documented in the HPI was reviewed and verified.  Menstrual History: OB History    Gravida Para Term Preterm AB Living   3 2 2  0 0 2   SAB TAB Ectopic Multiple Live Births   0 0 0 0 2       Patient's last menstrual period was 07/30/2016.    History reviewed. No pertinent past medical history.  History reviewed. No pertinent surgical history.   (Not in a hospital admission) No Known Allergies  Social History  Substance Use Topics  . Smoking status: Never Smoker  . Smokeless tobacco: Never Used  . Alcohol use No    History reviewed. No pertinent family history.   Review of Systems Constitutional: negative for weight loss Gastrointestinal: negative for vomiting Genitourinary:negative for genital lesions and vaginal discharge and dysuria Musculoskeletal:negative for back pain Behavioral/Psych: negative for abusive relationship, depression, illegal drug usage and tobacco use    Objective:    BP 99/66   Pulse 100   Wt 189 lb 1.6 oz (85.8 kg)   LMP 07/30/2016   BMI 30.52 kg/m  General Appearance:    Alert, cooperative, no distress, appears stated age  Head:    Normocephalic, without obvious abnormality, atraumatic  Eyes:    PERRL, conjunctiva/corneas clear, EOM's intact, fundi    benign, both eyes  Ears:    Normal TM's and external ear canals, both ears  Nose:   Nares normal, septum midline, mucosa normal, no drainage    or sinus tenderness  Throat:   Lips, mucosa, and tongue normal; teeth and gums normal  Neck:   Supple, symmetrical, trachea midline, no adenopathy;    thyroid:  no enlargement/tenderness/nodules; no  carotid   bruit or JVD  Back:     Symmetric, no curvature, ROM normal, no CVA tenderness  Lungs:     Clear to auscultation bilaterally, respirations unlabored  Chest Wall:    No tenderness or deformity   Heart:    Regular rate and rhythm, S1 and S2 normal, no murmur, rub   or gallop  Breast Exam:    No tenderness, masses, or nipple abnormality  Abdomen:     Soft, non-tender, bowel sounds active all four quadrants,    no masses, no organomegaly  Genitalia:    Normal female without lesion, discharge or tenderness  Extremities:   Extremities normal, atraumatic, no cyanosis or edema  Pulses:   2+ and symmetric all extremities  Skin:   Skin color, texture, turgor normal, no rashes or lesions  Lymph nodes:   Cervical, supraclavicular, and axillary nodes normal  Neurologic:   CNII-XII intact, normal strength, sensation and reflexes    throughout       Cervix:  Long, thick, closed and posterior. Friable on exam;     FH: 17 cm.  FHR: 154 by doppler.     Lab Review Urine pregnancy test Labs reviewed yes Radiologic studies reviewed yes  Assessment:    Pregnancy at 5475w5d weeks    1. Supervision of other normal pregnancy, antepartum    - Varicella zoster antibody, IgG - Hemoglobinopathy evaluation - Culture, OB Urine - MaterniT21 PLUS Core+SCA - Obstetric Panel,  Including HIV - Cystic Fibrosis Mutation 97 - Hemoglobin A1c - US MFM OB COMP + 14 WK; Future - Prenat-FeAsp-Meth-FA-DHA w/o A (PRENATE PIXIE) 10-0.6-0.4-200 MG CAPS; Take 1 tablet by mouth daily.  Dispense: 30 capsule; Refill: 12 - Cervicovaginal ancillary only     Prenatal vitamins.  Counseling provided regarding continued use of seat belts, cessation of alcohol consumption, smoking or use of illicit drugs; infection precautions i.e., influenza/TDAP immunizations, toxoplasmosis,CMV, parvovirus, listeria and varicella; workplace safety, exercise during pregnancy; routine dental care, safe medications, sexual activity, hot  tubs, saunas, pools, travel, caffeine use, fish and methlymercury, potential toxins, hair treatments, varicose veins Weight gain recommendations per IOM guidelines reviewed: underweight/BMI< 18.5--> gain 28 - 40 lbs; normal weight/BMI 18.5 - 24.9--> gain 25 - 35 lbs; overweight/BMI 25 - 29.9--> gain 15 - 25 lbs; obese/BMI >30->gain  11 - 20 lbs Problem list reviewed and updated. FIRST/CF mutation testing/NIPT/QUAD SCREEN/fragile X/Ashkenazi Jewish population testing/Spinal muscular atrophy discussed: ordered. Role of ultrasound in pregnancy discussed; fetal survey: ordered. Amniocentesis discussed: not indicated.  Meds ordered this encounter  Medications  . Prenatal Vit-Fe Fumarate-FA (MULTIVITAMIN-PRENATAL) 27-0.8 MG TABS tablet    Sig: Take 1 tablet by mouth daily at 12 noon.  . Prenat-FeAsp-Meth-FA-DHA w/o A (PRENATE PIXIE) 10-0.6-0.4-200 MG CAPS    Sig: Take 1 tablet by mouth daily.    Dispense:  30 capsule    Refill:  12    Please process coupon: Rx BIN: [id number], RxPCN: [id number], RxGRP: [id number], Rx: [id number]: [id number]  SUF: 01   Orders Placed This Encounter  Procedures  . Culture, OB Urine  . US MFM OB COMP + 14 WK    Standing Status:   Future    Standing Expiration Date:   03/04/2018    Order Specific Question:   Reason for Exam (SYMPTOM  OR DIAGNOSIS REQUIRED)    Answer:   fetal anatomy scan    Order Specific Question:   Preferred imaging location?    Answer:   [facility]  . Varicella zoster antibody, IgG  . Hemoglobinopathy evaluation  . MaterniT21 PLUS Core+SCA    Order Specific Question:   Is the patient insulin dependent?    Answer:   No    Order Specific Question:   Please enter gestational age. This should be expressed as weeks AND days, i.e. 16w 6d. Enter weeks here. Enter days in next question.    Answer:   4717    Order Specific Question:   Please enter gestational age. This should be expressed as weeks AND days, i.e. 16w 6d. Enter days here. Enter weeks in previous  question.    Answer:   5    Order Specific Question:   How was gestational age calculated?    Answer:   LMP    Order Specific Question:   Please give the date of LMP OR Ultrasound OR Estimated date of delivery.    Answer:   06/06/2017    Order Specific Question:   Number of Fetuses (Type of Pregnancy):    Answer:   1    Order Specific Question:   Indications for performing the test? (please choose all that apply):    Answer:   Routine screening    Order Specific Question:   Other Indications? (Y=Yes, N=No)    Answer:   N    Order Specific Question:   If this is a repeat specimen, please indicate the reason:    Answer:   Not indicated    Order Specific Question:  Please specify the patient's race: (C=White/Caucasion, B=Black, I=Native American, A=Asian, H=Hispanic, O=Other, U=Unknown)    Answer:   B    Order Specific Question:   Donor Egg - indicate if the egg was obtained from in vitro fertilization.    Answer:   N    Order Specific Question:   Age of Egg Donor.    Answer:   19    Order Specific Question:   Prior Down Syndrome/ONTD screening during current pregnancy.    Answer:   N    Order Specific Question:   Prior First Trimester Testing    Answer:   N    Order Specific Question:   Prior Second Trimester Testing    Answer:   N    Order Specific Question:   Family History of Neural Tube Defects    Answer:   N    Order Specific Question:   Prior Pregnancy with Down Syndrome    Answer:   N    Order Specific Question:   Please give the patient's weight (in pounds)    Answer:   189  . Obstetric Panel, Including HIV  . Cystic Fibrosis Mutation 97  . Hemoglobin A1c    Follow up in 4 weeks. 50% of 30 min visit spent on counseling and coordination of care.

## 2017-01-02 LAB — CERVICOVAGINAL ANCILLARY ONLY
Bacterial vaginitis: POSITIVE — AB
CANDIDA VAGINITIS: NEGATIVE
Chlamydia: NEGATIVE
NEISSERIA GONORRHEA: NEGATIVE
TRICH (WINDOWPATH): NEGATIVE

## 2017-01-03 ENCOUNTER — Other Ambulatory Visit: Payer: Self-pay | Admitting: Certified Nurse Midwife

## 2017-01-03 DIAGNOSIS — N760 Acute vaginitis: Secondary | ICD-10-CM

## 2017-01-03 DIAGNOSIS — B9689 Other specified bacterial agents as the cause of diseases classified elsewhere: Secondary | ICD-10-CM

## 2017-01-03 DIAGNOSIS — O99012 Anemia complicating pregnancy, second trimester: Secondary | ICD-10-CM

## 2017-01-03 LAB — HEMOGLOBINOPATHY EVALUATION
HGB C: 0 %
HGB S: 0 %
HGB VARIANT: 0 %
Hemoglobin A2 Quantitation: 2.1 % (ref 1.8–3.2)
Hemoglobin F Quantitation: 0 % (ref 0.0–2.0)
Hgb A: 97.9 % (ref 96.4–98.8)

## 2017-01-03 LAB — OBSTETRIC PANEL, INCLUDING HIV
Antibody Screen: NEGATIVE
BASOS: 0 %
Basophils Absolute: 0 10*3/uL (ref 0.0–0.2)
EOS (ABSOLUTE): 0.3 10*3/uL (ref 0.0–0.4)
Eos: 5 %
HEMATOCRIT: 29.7 % — AB (ref 34.0–46.6)
HIV Screen 4th Generation wRfx: NONREACTIVE
Hemoglobin: 10.3 g/dL — ABNORMAL LOW (ref 11.1–15.9)
Hepatitis B Surface Ag: NEGATIVE
Immature Grans (Abs): 0 10*3/uL (ref 0.0–0.1)
Immature Granulocytes: 0 %
Lymphocytes Absolute: 2.2 10*3/uL (ref 0.7–3.1)
Lymphs: 33 %
MCH: 25.7 pg — ABNORMAL LOW (ref 26.6–33.0)
MCHC: 34.7 g/dL (ref 31.5–35.7)
MCV: 74 fL — ABNORMAL LOW (ref 79–97)
MONOCYTES: 7 %
Monocytes Absolute: 0.5 10*3/uL (ref 0.1–0.9)
NEUTROS ABS: 3.6 10*3/uL (ref 1.4–7.0)
Neutrophils: 55 %
Platelets: 186 10*3/uL (ref 150–379)
RBC: 4.01 x10E6/uL (ref 3.77–5.28)
RDW: 22.2 % — AB (ref 12.3–15.4)
RH TYPE: POSITIVE
RPR Ser Ql: NONREACTIVE
Rubella Antibodies, IGG: 5.73 index (ref >0.99)
WBC: 6.5 10*3/uL (ref 3.4–10.8)

## 2017-01-03 LAB — CULTURE, OB URINE

## 2017-01-03 LAB — VARICELLA ZOSTER ANTIBODY, IGG: VARICELLA: 1670 {index} (ref >165)

## 2017-01-03 LAB — URINE CULTURE, OB REFLEX

## 2017-01-03 LAB — HEMOGLOBIN A1C
Est. average glucose Bld gHb Est-mCnc: 103 mg/dL
Hgb A1c MFr Bld: 5.2 % (ref 4.8–5.6)

## 2017-01-03 MED ORDER — CITRANATAL BLOOM 90-1 MG PO TABS: 1.0000 | ORAL_TABLET | Freq: Every day | ORAL | 12 refills | Status: DC

## 2017-01-03 MED ORDER — METRONIDAZOLE 500 MG PO TABS: 500.0000 mg | ORAL_TABLET | Freq: Two times a day (BID) | ORAL | 0 refills | Status: DC

## 2017-01-06 LAB — MATERNIT21 PLUS CORE+SCA
CHROMOSOME 18: NEGATIVE
Chromosome 13: NEGATIVE
Chromosome 21: NEGATIVE
Y Chromosome: NOT DETECTED

## 2017-01-08 ENCOUNTER — Other Ambulatory Visit: Payer: Self-pay | Admitting: Certified Nurse Midwife

## 2017-01-08 DIAGNOSIS — Z3480 Encounter for supervision of other normal pregnancy, unspecified trimester: Secondary | ICD-10-CM

## 2017-01-09 ENCOUNTER — Other Ambulatory Visit: Payer: Self-pay | Admitting: Certified Nurse Midwife

## 2017-01-09 DIAGNOSIS — Z3480 Encounter for supervision of other normal pregnancy, unspecified trimester: Secondary | ICD-10-CM

## 2017-01-09 LAB — CYSTIC FIBROSIS MUTATION 97: Interpretation: NOT DETECTED

## 2017-01-10 ENCOUNTER — Ambulatory Visit (HOSPITAL_COMMUNITY)
Admission: RE | Admit: 2017-01-10 | Discharge: 2017-01-10 | Disposition: A | Discharge status: Home / Self Care | Payer: Medicaid Other | Source: Ambulatory Visit | Attending: Certified Nurse Midwife | Admitting: Certified Nurse Midwife

## 2017-01-10 DIAGNOSIS — Z3482 Encounter for supervision of other normal pregnancy, second trimester: Secondary | ICD-10-CM | POA: Insufficient documentation

## 2017-01-10 DIAGNOSIS — Z3480 Encounter for supervision of other normal pregnancy, unspecified trimester: Secondary | ICD-10-CM | POA: Diagnosis present

## 2017-01-10 DIAGNOSIS — Z363 Encounter for antenatal screening for malformations: Secondary | ICD-10-CM | POA: Insufficient documentation

## 2017-01-11 ENCOUNTER — Ambulatory Visit (HOSPITAL_COMMUNITY): Payer: Medicaid Other

## 2017-01-17 ENCOUNTER — Other Ambulatory Visit: Payer: Self-pay | Admitting: Certified Nurse Midwife

## 2017-01-17 DIAGNOSIS — Z3480 Encounter for supervision of other normal pregnancy, unspecified trimester: Secondary | ICD-10-CM

## 2017-01-30 ENCOUNTER — Encounter: Payer: Medicaid Other | Admitting: Certified Nurse Midwife

## 2017-02-12 ENCOUNTER — Ambulatory Visit (INDEPENDENT_AMBULATORY_CARE_PROVIDER_SITE_OTHER): Payer: Medicaid Other | Admitting: Certified Nurse Midwife

## 2017-02-12 DIAGNOSIS — Z3480 Encounter for supervision of other normal pregnancy, unspecified trimester: Secondary | ICD-10-CM

## 2017-02-12 DIAGNOSIS — Z3482 Encounter for supervision of other normal pregnancy, second trimester: Secondary | ICD-10-CM

## 2017-02-12 DIAGNOSIS — O2242 Hemorrhoids in pregnancy, second trimester: Secondary | ICD-10-CM | POA: Insufficient documentation

## 2017-02-12 NOTE — Progress Notes (Signed)
   PRENATAL VISIT NOTE  Subjective:  Christine Hodge is a 26 y.o. G3P2002 at 5179w5d being seen today for ongoing prenatal care.  She is currently monitored for the following issues for this low-risk pregnancy and has Hypopigmentation; Supervision of other normal pregnancy, antepartum; and Hemorrhoids during pregnancy in second trimester on her problem list.  Patient reports no bleeding, no contractions, no cramping, no leaking and rectal itching, denies constipation or blood in stool.  Contractions: Not present. Vag. Bleeding: None.  Movement: Present. Denies leaking of fluid.   The following portions of the patient's history were reviewed and updated as appropriate: allergies, current medications, past family history, past medical history, past social history, past surgical history and problem list. Problem list updated.  Objective:   Vitals:   02/12/17 1537  BP: 103/64  Pulse: 86  Weight: 183 lb 9.6 oz (83.3 kg)    Fetal Status: Fetal Heart Rate (bpm): 135; doppler Fundal Height: 23 cm Movement: Present     General:  Alert, oriented and cooperative. Patient is in no acute distress.  Skin: Skin is warm and dry. No rash noted.   Cardiovascular: Normal heart rate noted  Respiratory: Normal respiratory effort, no problems with respiration noted  Abdomen: Soft, gravid, appropriate for gestational age.  Pain/Pressure: Absent     Pelvic: Cervical exam deferred      Rectum, small hemorrhoid noted in anal canal, internal rectal exam not performed  Extremities: Normal range of motion.  Edema: None  Mental Status:  Normal mood and affect. Normal behavior. Normal judgment and thought content.   Assessment and Plan:  Pregnancy: G3P2002 at 3279w5d  1. Supervision of other normal pregnancy, antepartum        2. Hemorrhoids during pregnancy in second trimester      OTC Colace/Tucks  Preterm labor symptoms and general obstetric precautions including but not limited to vaginal bleeding,  contractions, leaking of fluid and fetal movement were reviewed in detail with the patient. Please refer to After Visit Summary for other counseling recommendations.  Return in about 4 weeks (around 03/12/2017) for ROB, 2 hr OGTT.   Roe Coombsachelle A Allana Shrestha, CNM

## 2017-02-12 NOTE — Progress Notes (Signed)
Anal Itching

## 2017-02-19 ENCOUNTER — Ambulatory Visit (HOSPITAL_COMMUNITY)
Admission: RE | Admit: 2017-02-19 | Discharge: 2017-02-19 | Disposition: A | Discharge status: Home / Self Care | Payer: Medicaid Other | Source: Ambulatory Visit | Attending: Certified Nurse Midwife | Admitting: Certified Nurse Midwife

## 2017-02-19 DIAGNOSIS — Z362 Encounter for other antenatal screening follow-up: Secondary | ICD-10-CM | POA: Diagnosis not present

## 2017-02-19 DIAGNOSIS — Z3480 Encounter for supervision of other normal pregnancy, unspecified trimester: Secondary | ICD-10-CM

## 2017-02-19 DIAGNOSIS — Z3A25 25 weeks gestation of pregnancy: Secondary | ICD-10-CM | POA: Insufficient documentation

## 2017-02-20 ENCOUNTER — Other Ambulatory Visit: Payer: Self-pay | Admitting: Certified Nurse Midwife

## 2017-02-20 DIAGNOSIS — Z3480 Encounter for supervision of other normal pregnancy, unspecified trimester: Secondary | ICD-10-CM

## 2017-03-12 ENCOUNTER — Ambulatory Visit (INDEPENDENT_AMBULATORY_CARE_PROVIDER_SITE_OTHER): Payer: Medicaid Other | Admitting: Certified Nurse Midwife

## 2017-03-12 ENCOUNTER — Other Ambulatory Visit: Payer: Medicaid Other

## 2017-03-12 VITALS — BP 98/66 | HR 87 | Wt 183.6 lb

## 2017-03-12 DIAGNOSIS — R509 Fever, unspecified: Secondary | ICD-10-CM

## 2017-03-12 DIAGNOSIS — Z3482 Encounter for supervision of other normal pregnancy, second trimester: Secondary | ICD-10-CM

## 2017-03-12 DIAGNOSIS — Z3480 Encounter for supervision of other normal pregnancy, unspecified trimester: Secondary | ICD-10-CM

## 2017-03-12 DIAGNOSIS — R69 Illness, unspecified: Secondary | ICD-10-CM

## 2017-03-12 DIAGNOSIS — J111 Influenza due to unidentified influenza virus with other respiratory manifestations: Secondary | ICD-10-CM

## 2017-03-12 LAB — POCT RAPID STREP A (OFFICE): RAPID STREP A SCREEN: NEGATIVE

## 2017-03-12 MED ORDER — OSELTAMIVIR PHOSPHATE 75 MG PO CAPS: 75.0000 mg | ORAL_CAPSULE | Freq: Two times a day (BID) | ORAL | 0 refills | Status: DC

## 2017-03-12 MED ORDER — BENZONATATE 100 MG PO CAPS: 100.0000 mg | ORAL_CAPSULE | Freq: Three times a day (TID) | ORAL | 0 refills | Status: DC | PRN

## 2017-03-12 NOTE — Progress Notes (Signed)
   PRENATAL VISIT NOTE  Subjective:  Christine Hodge is a 26 y.o. G3P2002 at [redacted]w[redacted]d being seen today for ongoing prenatal care.  She is currently monitored for the following issues for this low-risk pregnancy and has Hypopigmentation; Supervision of other normal pregnancy, antepartum; and Hemorrhoids during pregnancy in second trimester on her problem list.  Patient reports no bleeding, no contractions, no cramping, no leaking and uri symptoms c/w influenza. URI symptoms X 4 days, reports fever broke yesturday, reports nasal congestion, cough, no sputum production  Contractions: Not present. Vag. Bleeding: None.  Movement: Present. Denies leaking of fluid.   The following portions of the patient's history were reviewed and updated as appropriate: allergies, current medications, past family history, past medical history, past social history, past surgical history and problem list. Problem list updated.  Objective:   Vitals:   03/12/17 0921  BP: 98/66  Pulse: 87  Weight: 183 lb 9.6 oz (83.3 kg)    Fetal Status: Fetal Heart Rate (bpm): 133; doppler Fundal Height: 27 cm Movement: Present     General:  Alert, oriented and cooperative. Patient is in no acute distress.  Skin: Skin is warm and dry. No rash noted.   Cardiovascular: Normal heart rate noted  Respiratory: Normal respiratory effort, no problems with respiration noted  Abdomen: Soft, gravid, appropriate for gestational age.  Pain/Pressure: Absent     Pelvic: Cervical exam deferred        Extremities: Normal range of motion.  Edema: None  Mental Status:  Normal mood and affect. Normal behavior. Normal judgment and thought content.    Rapid strep negative  Assessment and Plan:  Pregnancy: G3P2002 at [redacted]w[redacted]d  1. Supervision of other normal pregnancy, antepartum      - Glucose Tolerance, 2 Hours w/1 Hour - CBC - HIV antibody - RPR  2. Influenza-like illness     - oseltamivir (TAMIFLU) 75 MG capsule; Take 1 capsule (75 mg  total) by mouth 2 (two) times daily.  Dispense: 14 capsule; Refill: 0 - benzonatate (TESSALON PERLES) 100 MG capsule; Take 1 capsule (100 mg total) by mouth 3 (three) times daily as needed for cough.  Dispense: 30 capsule; Refill: 0  3. Fever, unspecified fever cause    - POCT rapid strep A - Viral Cult, Rapid, Respiratory  Preterm labor symptoms and general obstetric precautions including but not limited to vaginal bleeding, contractions, leaking of fluid and fetal movement were reviewed in detail with the patient. Please refer to After Visit Summary for other counseling recommendations.  Return in about 2 weeks (around 03/26/2017) for ROB.   Roe Coombs, CNM

## 2017-03-12 NOTE — Progress Notes (Signed)
Pt c/o having a cold x4d.

## 2017-03-13 ENCOUNTER — Other Ambulatory Visit: Payer: Self-pay | Admitting: Certified Nurse Midwife

## 2017-03-13 DIAGNOSIS — Z3480 Encounter for supervision of other normal pregnancy, unspecified trimester: Secondary | ICD-10-CM

## 2017-03-13 LAB — CBC
HEMOGLOBIN: 11.5 g/dL (ref 11.1–15.9)
Hematocrit: 33.8 % — ABNORMAL LOW (ref 34.0–46.6)
MCH: 28.3 pg (ref 26.6–33.0)
MCHC: 34 g/dL (ref 31.5–35.7)
MCV: 83 fL (ref 79–97)
Platelets: 182 10*3/uL (ref 150–379)
RBC: 4.06 x10E6/uL (ref 3.77–5.28)
RDW: 16.1 % — ABNORMAL HIGH (ref 12.3–15.4)
WBC: 4.6 10*3/uL (ref 3.4–10.8)

## 2017-03-13 LAB — GLUCOSE TOLERANCE, 2 HOURS W/ 1HR
GLUCOSE, 1 HOUR: 154 mg/dL (ref 65–179)
GLUCOSE, FASTING: 72 mg/dL (ref 65–91)
Glucose, 2 hour: 128 mg/dL (ref 65–152)

## 2017-03-13 LAB — RPR: RPR: NONREACTIVE

## 2017-03-13 LAB — HIV ANTIBODY (ROUTINE TESTING W REFLEX): HIV Screen 4th Generation wRfx: NONREACTIVE

## 2017-03-15 LAB — VIRAL CULT, RAPID, RESPIRATORY
ADENOVIRUS: NEGATIVE
INFLUENZA B: NEGATIVE
Influenza A: NEGATIVE
Parainfluenza 1: NEGATIVE
Parainfluenza 2: NEGATIVE
Parainfluenza 3: NEGATIVE
RESPIRATORY SYNCYTIAL VIRUS: NEGATIVE

## 2017-03-26 ENCOUNTER — Encounter: Payer: Medicaid Other | Admitting: Certified Nurse Midwife

## 2017-04-04 ENCOUNTER — Ambulatory Visit (INDEPENDENT_AMBULATORY_CARE_PROVIDER_SITE_OTHER): Payer: Medicaid Other | Admitting: Certified Nurse Midwife

## 2017-04-04 DIAGNOSIS — Z3483 Encounter for supervision of other normal pregnancy, third trimester: Secondary | ICD-10-CM

## 2017-04-04 DIAGNOSIS — Z3480 Encounter for supervision of other normal pregnancy, unspecified trimester: Secondary | ICD-10-CM

## 2017-04-04 DIAGNOSIS — O2603 Excessive weight gain in pregnancy, third trimester: Secondary | ICD-10-CM

## 2017-04-04 NOTE — Progress Notes (Signed)
   PRENATAL VISIT NOTE  Subjective:  Christine Hodge is a 26 y.o. G3P2002 at 3254w0d being seen today for ongoing prenatal care.  She is currently monitored for the following issues for this low-risk pregnancy and has Hypopigmentation; Supervision of other normal pregnancy, antepartum; and Hemorrhoids during pregnancy in second trimester on her problem list.  Patient reports no complaints.  Contractions: Not present. Vag. Bleeding: None.  Movement: Present. Denies leaking of fluid.   The following portions of the patient's history were reviewed and updated as appropriate: allergies, current medications, past family history, past medical history, past social history, past surgical history and problem list. Problem list updated.  Objective:   Vitals:   04/04/17 1537  BP: 111/69  Pulse: 100  Weight: 184 lb (83.5 kg)    Fetal Status: Fetal Heart Rate (bpm): 145; doppler Fundal Height: 30 cm Movement: Present     General:  Alert, oriented and cooperative. Patient is in no acute distress.  Skin: Skin is warm and dry. No rash noted.   Cardiovascular: Normal heart rate noted  Respiratory: Normal respiratory effort, no problems with respiration noted  Abdomen: Soft, gravid, appropriate for gestational age.  Pain/Pressure: Absent     Pelvic: Cervical exam deferred        Extremities: Normal range of motion.  Edema: None  Mental Status:  Normal mood and affect. Normal behavior. Normal judgment and thought content.   Assessment and Plan:  Pregnancy: G3P2002 at 454w0d  1. Supervision of other normal pregnancy, antepartum     Doing well. Will be having dental work done.  Preterm labor symptoms and general obstetric precautions including but not limited to vaginal bleeding, contractions, leaking of fluid and fetal movement were reviewed in detail with the patient. Please refer to After Visit Summary for other counseling recommendations.  Return in about 2 weeks (around 04/18/2017) for  ROB.   Roe Coombsachelle A Colbin Jovel, CNM

## 2017-04-16 ENCOUNTER — Encounter (HOSPITAL_COMMUNITY): Payer: Self-pay

## 2017-04-16 ENCOUNTER — Other Ambulatory Visit: Payer: Self-pay

## 2017-04-16 ENCOUNTER — Inpatient Hospital Stay (HOSPITAL_COMMUNITY)
Admission: AD | Admit: 2017-04-16 | Discharge: 2017-04-16 | Disposition: A | Discharge status: Home / Self Care | Payer: Medicaid Other | Source: Ambulatory Visit | Attending: Family Medicine | Admitting: Family Medicine

## 2017-04-16 DIAGNOSIS — Z3A32 32 weeks gestation of pregnancy: Secondary | ICD-10-CM | POA: Insufficient documentation

## 2017-04-16 DIAGNOSIS — R102 Pelvic and perineal pain: Secondary | ICD-10-CM | POA: Diagnosis not present

## 2017-04-16 DIAGNOSIS — R109 Unspecified abdominal pain: Secondary | ICD-10-CM

## 2017-04-16 DIAGNOSIS — O26893 Other specified pregnancy related conditions, third trimester: Secondary | ICD-10-CM | POA: Diagnosis not present

## 2017-04-16 DIAGNOSIS — R1030 Lower abdominal pain, unspecified: Secondary | ICD-10-CM | POA: Insufficient documentation

## 2017-04-16 DIAGNOSIS — O26899 Other specified pregnancy related conditions, unspecified trimester: Secondary | ICD-10-CM

## 2017-04-16 LAB — COMPREHENSIVE METABOLIC PANEL
ALK PHOS: 75 U/L (ref 38–126)
ALT: 15 U/L (ref 14–54)
ANION GAP: 7 (ref 5–15)
AST: 19 U/L (ref 15–41)
Albumin: 2.9 g/dL — ABNORMAL LOW (ref 3.5–5.0)
BILIRUBIN TOTAL: 0.3 mg/dL (ref 0.3–1.2)
BUN: 5 mg/dL — AB (ref 6–20)
CALCIUM: 8.5 mg/dL — AB (ref 8.9–10.3)
CO2: 24 mmol/L (ref 22–32)
Chloride: 104 mmol/L (ref 101–111)
Creatinine, Ser: 0.5 mg/dL (ref 0.44–1.00)
GFR calc Af Amer: >60 mL/min (ref >60)
Glucose, Bld: 108 mg/dL — ABNORMAL HIGH (ref 65–99)
POTASSIUM: 3.2 mmol/L — AB (ref 3.5–5.1)
Sodium: 135 mmol/L (ref 135–145)
TOTAL PROTEIN: 6.8 g/dL (ref 6.5–8.1)

## 2017-04-16 LAB — CBC WITH DIFFERENTIAL/PLATELET
BASOS ABS: 0 10*3/uL (ref 0.0–0.1)
Basophils Relative: 0 %
Eosinophils Absolute: 0.3 10*3/uL (ref 0.0–0.7)
Eosinophils Relative: 4 %
HEMATOCRIT: 31.7 % — AB (ref 36.0–46.0)
HEMOGLOBIN: 10.6 g/dL — AB (ref 12.0–15.0)
LYMPHS PCT: 38 %
Lymphs Abs: 2.3 10*3/uL (ref 0.7–4.0)
MCH: 28.1 pg (ref 26.0–34.0)
MCHC: 33.4 g/dL (ref 30.0–36.0)
MCV: 84.1 fL (ref 78.0–100.0)
MONO ABS: 0.5 10*3/uL (ref 0.1–1.0)
Monocytes Relative: 7 %
NEUTROS ABS: 3.1 10*3/uL (ref 1.7–7.7)
NEUTROS PCT: 51 %
Platelets: 154 10*3/uL (ref 150–400)
RBC: 3.77 MIL/uL — AB (ref 3.87–5.11)
RDW: 14 % (ref 11.5–15.5)
WBC: 6.2 10*3/uL (ref 4.0–10.5)

## 2017-04-16 LAB — URINALYSIS, ROUTINE W REFLEX MICROSCOPIC
Bilirubin Urine: NEGATIVE
GLUCOSE, UA: NEGATIVE mg/dL
Hgb urine dipstick: NEGATIVE
Ketones, ur: 20 mg/dL — AB
LEUKOCYTES UA: NEGATIVE
Nitrite: NEGATIVE
PROTEIN: NEGATIVE mg/dL
Specific Gravity, Urine: 1.013 (ref 1.005–1.030)
pH: 7 (ref 5.0–8.0)

## 2017-04-16 MED ORDER — OXYCODONE-ACETAMINOPHEN 5-325 MG PO TABS
1.0000 | ORAL_TABLET | Freq: Once | ORAL | Status: AC
  Administered 2017-04-16: 1 via ORAL
  Filled 2017-04-16: qty 1

## 2017-04-16 NOTE — MAU Provider Note (Signed)
History     CSN: [id number]  Arrival date and time: 04/16/17 1359   First Provider Initiated Contact with Patient 04/16/17 1429     Chief Complaint  Patient presents with  . Abdominal Pain   HPI Christine Hodge is a 26 y.o. G3P2002 at 8445w5d who presents via EMS with right sided lower abdominal pain. She states the pain started at 10am and is constant. She rates the pain a 9/10 and has not tried anything for the pain. She denies any vaginal bleeding, leaking of fluid or discharge. She reports good fetal movement.   OB History    Gravida Para Term Preterm AB Living   3 2 2  0 0 2   SAB TAB Ectopic Multiple Live Births   0 0 0 0 2      History reviewed. No pertinent past medical history.  History reviewed. No pertinent surgical history.  History reviewed. No pertinent family history.  Social History   Tobacco Use  . Smoking status: Never Smoker  . Smokeless tobacco: Never Used  Substance Use Topics  . Alcohol use: No    Alcohol/week: 0.0 oz  . Drug use: No    Allergies: No Known Allergies  Medications Prior to Admission  Medication Sig Dispense Refill Last Dose  . acetaminophen (TYLENOL) 325 MG tablet Take 650 mg every 6 (six) hours as needed by mouth for headache.   04/02/2017  . Prenat-FeAsp-Meth-FA-DHA w/o A (PRENATE PIXIE) 10-0.6-0.4-200 MG CAPS Take 1 tablet by mouth daily. 30 capsule 12 04/15/2017 at Unknown time    Review of Systems  Constitutional: Negative.  Negative for fatigue and fever.  HENT: Negative.   Respiratory: Negative.  Negative for shortness of breath.   Cardiovascular: Negative.  Negative for chest pain.  Gastrointestinal: Positive for abdominal pain. Negative for constipation, diarrhea, nausea and vomiting.  Genitourinary: Negative.  Negative for dysuria, vaginal bleeding and vaginal discharge.  Neurological: Negative.  Negative for dizziness and headaches.   Physical Exam   Blood pressure 114/66, pulse 98, temperature 97.9 F (36.6  C), temperature source Oral, resp. rate 20, last menstrual period 07/30/2016.  Physical Exam  Nursing note and vitals reviewed. Constitutional: She is oriented to person, place, and time. She appears well-developed and well-nourished. No distress.  HENT:  Head: Normocephalic.  Eyes: Pupils are equal, round, and reactive to light.  Cardiovascular: Normal rate, regular rhythm and normal heart sounds.  Respiratory: Effort normal and breath sounds normal. No respiratory distress.  GI: Soft. Bowel sounds are normal. She exhibits no distension. There is tenderness in the right lower quadrant and suprapubic area. There is guarding. There is no rebound.  Neurological: She is alert and oriented to person, place, and time.  Skin: Skin is warm and dry.  Psychiatric: She has a normal mood and affect. Her behavior is normal. Judgment and thought content normal.   Dilation: Closed Effacement (%): Thick Cervical Position: Posterior Station: Ballotable Exam by:: Antony Odeaaroline neil, CNM   Fetal Tracing:  Baseline: 120 bpm Variability: moderate Accels: 15x15 Decels: none  Toco: none  MAU Course  Procedures Results for orders placed or performed during the hospital encounter of 04/16/17 (from the past 24 hour(s))  CBC with Differential     Status: Abnormal   Collection Time: 04/16/17  2:58 PM  Result Value Ref Range   WBC 6.2 4.0 - 10.5 K/uL   RBC 3.77 (L) 3.87 - 5.11 MIL/uL   Hemoglobin 10.6 (L) 12.0 - 15.0 g/dL   HCT 40.931.7 (L)  36.0 - 46.0 %   MCV 84.1 78.0 - 100.0 fL   MCH 28.1 26.0 - 34.0 pg   MCHC 33.4 30.0 - 36.0 g/dL   RDW 40.914.0 81.111.5 - 91.415.5 %   Platelets 154 150 - 400 K/uL   Neutrophils Relative % 51 %   Neutro Abs 3.1 1.7 - 7.7 K/uL   Lymphocytes Relative 38 %   Lymphs Abs 2.3 0.7 - 4.0 K/uL   Monocytes Relative 7 %   Monocytes Absolute 0.5 0.1 - 1.0 K/uL   Eosinophils Relative 4 %   Eosinophils Absolute 0.3 0.0 - 0.7 K/uL   Basophils Relative 0 %   Basophils Absolute 0.0 0.0 -  0.1 K/uL  Comprehensive metabolic panel     Status: Abnormal   Collection Time: 04/16/17  2:58 PM  Result Value Ref Range   Sodium 135 135 - 145 mmol/L   Potassium 3.2 (L) 3.5 - 5.1 mmol/L   Chloride 104 101 - 111 mmol/L   CO2 24 22 - 32 mmol/L   Glucose, Bld 108 (H) 65 - 99 mg/dL   BUN 5 (L) 6 - 20 mg/dL   Creatinine, Ser 7.820.50 0.44 - 1.00 mg/dL   Calcium 8.5 (L) 8.9 - 10.3 mg/dL   Total Protein 6.8 6.5 - 8.1 g/dL   Albumin 2.9 (L) 3.5 - 5.0 g/dL   AST 19 15 - 41 U/L   ALT 15 14 - 54 U/L   Alkaline Phosphatase 75 38 - 126 U/L   Total Bilirubin 0.3 0.3 - 1.2 mg/dL   GFR calc non Af Amer >60 >60 mL/min   GFR calc Af Amer >60 >60 mL/min   Anion gap 7 5 - 15  Urinalysis, Routine w reflex microscopic     Status: Abnormal   Collection Time: 04/16/17  3:37 PM  Result Value Ref Range   Color, Urine YELLOW YELLOW   APPearance HAZY (A) CLEAR   Specific Gravity, Urine 1.013 1.005 - 1.030   pH 7.0 5.0 - 8.0   Glucose, UA NEGATIVE NEGATIVE mg/dL   Hgb urine dipstick NEGATIVE NEGATIVE   Bilirubin Urine NEGATIVE NEGATIVE   Ketones, ur 20 (A) NEGATIVE mg/dL   Protein, ur NEGATIVE NEGATIVE mg/dL   Nitrite NEGATIVE NEGATIVE   Leukocytes, UA NEGATIVE NEGATIVE    MDM UA CBC with diff CMP Percocet PO- patient reports no pain after medication Low suspicion for appendicitis due to location of pain and absence of fever, leukocytosis and GI complaints.  Assessment and Plan   1. Pain of round ligament affecting pregnancy, antepartum   2. [redacted] weeks gestation of pregnancy   3. Abdominal pain affecting pregnancy    -Discharge home in stable condition -Preterm labor and strict appendicitis precautions discussed -Encouraged patient to wear maternity support belt and increase water intake -[facility] -Patient may return to [facility]U as needed or if her condition were to change or worsen  Rolm BookbinderCaroline M Brigitta Pricer  CNM 04/16/2017, 4:53 PM

## 2017-04-16 NOTE — Discharge Instructions (Signed)

## 2017-04-16 NOTE — MAU Note (Signed)
Pt presents to MAU via EMS for c/o of right sided lower abdominal pain that started around 10 am today. Pt denies LOF and vaginal bleeding. +FM

## 2017-04-18 ENCOUNTER — Encounter: Payer: Medicaid Other | Admitting: Obstetrics

## 2017-04-24 ENCOUNTER — Ambulatory Visit (INDEPENDENT_AMBULATORY_CARE_PROVIDER_SITE_OTHER): Payer: Medicaid Other | Admitting: Certified Nurse Midwife

## 2017-04-24 ENCOUNTER — Other Ambulatory Visit: Payer: Self-pay

## 2017-04-24 ENCOUNTER — Encounter: Payer: Self-pay | Admitting: Certified Nurse Midwife

## 2017-04-24 VITALS — BP 101/60 | HR 104 | Wt 187.0 lb

## 2017-04-24 DIAGNOSIS — Z3483 Encounter for supervision of other normal pregnancy, third trimester: Secondary | ICD-10-CM

## 2017-04-24 DIAGNOSIS — Z3480 Encounter for supervision of other normal pregnancy, unspecified trimester: Secondary | ICD-10-CM

## 2017-04-24 NOTE — Progress Notes (Signed)
   PRENATAL VISIT NOTE  Subjective:  Christine Hodge is a 26 y.o. G3P2002 at 4654w6d being seen today for ongoing prenatal care.  She is currently monitored for the following issues for this low-risk pregnancy and has Hypopigmentation; Supervision of other normal pregnancy, antepartum; Hemorrhoids during pregnancy in second trimester; and Maternal excessive weight gain, third trimester on their problem list.  Patient reports no complaints.  Contractions: Not present. Vag. Bleeding: None.  Movement: Present. Denies leaking of fluid.   The following portions of the patient's history were reviewed and updated as appropriate: allergies, current medications, past family history, past medical history, past social history, past surgical history and problem list. Problem list updated.  Objective:   Vitals:   04/24/17 1504  BP: 101/60  Pulse: (!) 104  Weight: 187 lb (84.8 kg)    Fetal Status: Fetal Heart Rate (bpm): 154; doppler Fundal Height: 34 cm Movement: Present     General:  Alert, oriented and cooperative. Patient is in no acute distress.  Skin: Skin is warm and dry. No rash noted.   Cardiovascular: Normal heart rate noted  Respiratory: Normal respiratory effort, no problems with respiration noted  Abdomen: Soft, gravid, appropriate for gestational age.  Pain/Pressure: Absent     Pelvic: Cervical exam deferred        Extremities: Normal range of motion.  Edema: None  Mental Status:  Normal mood and affect. Normal behavior. Normal judgment and thought content.   Assessment and Plan:  Pregnancy: G3P2002 at 2754w6d  1. Supervision of other normal pregnancy, antepartum     Doing well.    Preterm labor symptoms and general obstetric precautions including but not limited to vaginal bleeding, contractions, leaking of fluid and fetal movement were reviewed in detail with the patient. Please refer to After Visit Summary for other counseling recommendations.  Return in about 2 weeks (around  05/08/2017) for ROB.   Roe Coombsachelle A Nandita Mathenia, CNM

## 2017-04-24 NOTE — Progress Notes (Signed)
Patient reports no problems today.  

## 2017-05-08 ENCOUNTER — Ambulatory Visit (INDEPENDENT_AMBULATORY_CARE_PROVIDER_SITE_OTHER): Payer: Medicaid Other | Admitting: Certified Nurse Midwife

## 2017-05-08 ENCOUNTER — Encounter: Payer: Self-pay | Admitting: Certified Nurse Midwife

## 2017-05-08 ENCOUNTER — Other Ambulatory Visit (HOSPITAL_COMMUNITY)
Admission: RE | Admit: 2017-05-08 | Discharge: 2017-05-08 | Disposition: A | Discharge status: Home / Self Care | Payer: Medicaid Other | Source: Ambulatory Visit | Attending: Certified Nurse Midwife | Admitting: Certified Nurse Midwife

## 2017-05-08 VITALS — BP 107/66 | HR 111 | Wt 188.4 lb

## 2017-05-08 DIAGNOSIS — Z3493 Encounter for supervision of normal pregnancy, unspecified, third trimester: Secondary | ICD-10-CM | POA: Diagnosis present

## 2017-05-08 DIAGNOSIS — Z3A35 35 weeks gestation of pregnancy: Secondary | ICD-10-CM | POA: Insufficient documentation

## 2017-05-08 DIAGNOSIS — O98813 Other maternal infectious and parasitic diseases complicating pregnancy, third trimester: Secondary | ICD-10-CM | POA: Diagnosis not present

## 2017-05-08 DIAGNOSIS — Z3483 Encounter for supervision of other normal pregnancy, third trimester: Secondary | ICD-10-CM

## 2017-05-08 DIAGNOSIS — Z3480 Encounter for supervision of other normal pregnancy, unspecified trimester: Secondary | ICD-10-CM

## 2017-05-08 NOTE — Progress Notes (Signed)
Patient reports good fetal movement, denies pain. 

## 2017-05-08 NOTE — Progress Notes (Signed)
   PRENATAL VISIT NOTE  Subjective:  Christine Hodge is a 26 y.o. G3P2002 at 1294w6d being seen today for ongoing prenatal care.  She is currently monitored for the following issues for this low-risk pregnancy and has Hypopigmentation; Supervision of other normal pregnancy, antepartum; Hemorrhoids during pregnancy in second trimester; and Maternal excessive weight gain, third trimester on their problem list.  Patient reports no bleeding, no contractions, no cramping, no leaking and acute URI X 3days; has not taken anything for it.  Spouse is sick also.  Contractions: Not present. Vag. Bleeding: None.  Movement: Present. Denies leaking of fluid.   The following portions of the patient's history were reviewed and updated as appropriate: allergies, current medications, past family history, past medical history, past social history, past surgical history and problem list. Problem list updated.  Objective:   Vitals:   05/08/17 1451  BP: 107/66  Pulse: (!) 111  Weight: 188 lb 6.4 oz (85.5 kg)    Fetal Status: Fetal Heart Rate (bpm): 155; doppler Fundal Height: 35 cm Movement: Present  Presentation: Vertex  General:  Alert, oriented and cooperative. Patient is in no acute distress.  Skin: Skin is warm and dry. No rash noted.   Cardiovascular: Normal heart rate noted  Respiratory: Normal respiratory effort, no problems with respiration noted  Abdomen: Soft, gravid, appropriate for gestational age.  Pain/Pressure: Absent     Pelvic: Cervical exam performed Dilation: Closed Effacement (%): 0 Station: Ballotable  Extremities: Normal range of motion.  Edema: None  Mental Status:  Normal mood and affect. Normal behavior. Normal judgment and thought content.   Assessment and Plan:  Pregnancy: G3P2002 at 5494w6d  1. Supervision of other normal pregnancy, antepartum      Doing well - Strep Gp B NAA - Cervicovaginal ancillary only  Preterm labor symptoms and general obstetric precautions including  but not limited to vaginal bleeding, contractions, leaking of fluid and fetal movement were reviewed in detail with the patient. Please refer to After Visit Summary for other counseling recommendations.  Return in about 1 week (around 05/15/2017) for ROB.   Roe Coombsachelle A Mandeep Ferch, CNM

## 2017-05-09 LAB — CERVICOVAGINAL ANCILLARY ONLY
BACTERIAL VAGINITIS: NEGATIVE
CANDIDA VAGINITIS: POSITIVE — AB
CHLAMYDIA, DNA PROBE: NEGATIVE
Neisseria Gonorrhea: NEGATIVE
TRICH (WINDOWPATH): NEGATIVE

## 2017-05-10 LAB — STREP GP B NAA: Strep Gp B NAA: NEGATIVE

## 2017-05-11 ENCOUNTER — Other Ambulatory Visit: Payer: Self-pay | Admitting: Certified Nurse Midwife

## 2017-05-11 DIAGNOSIS — B373 Candidiasis of vulva and vagina: Secondary | ICD-10-CM

## 2017-05-11 DIAGNOSIS — Z3480 Encounter for supervision of other normal pregnancy, unspecified trimester: Secondary | ICD-10-CM

## 2017-05-11 DIAGNOSIS — B3731 Acute candidiasis of vulva and vagina: Secondary | ICD-10-CM

## 2017-05-11 MED ORDER — TERCONAZOLE 0.8 % VA CREA: 1.0000 | TOPICAL_CREAM | Freq: Every day | VAGINAL | 0 refills | Status: DC

## 2017-05-11 MED ORDER — FLUCONAZOLE 150 MG PO TABS: 150.0000 mg | ORAL_TABLET | Freq: Once | ORAL | 0 refills | Status: AC

## 2017-05-15 ENCOUNTER — Encounter: Payer: Medicaid Other | Admitting: Certified Nurse Midwife

## 2017-05-17 ENCOUNTER — Ambulatory Visit (INDEPENDENT_AMBULATORY_CARE_PROVIDER_SITE_OTHER): Payer: Medicaid Other | Admitting: Obstetrics & Gynecology

## 2017-05-17 ENCOUNTER — Encounter: Payer: Self-pay | Admitting: Obstetrics & Gynecology

## 2017-05-17 VITALS — BP 103/65 | HR 96 | Wt 189.8 lb

## 2017-05-17 DIAGNOSIS — Z3480 Encounter for supervision of other normal pregnancy, unspecified trimester: Secondary | ICD-10-CM

## 2017-05-17 NOTE — Progress Notes (Signed)
   PRENATAL VISIT NOTE  Subjective:  Christine Hodge is a 26 y.o. G3P2002 at 3131w1d being seen today for ongoing prenatal care.  She is currently monitored for the following issues for this low-risk pregnancy and has Hypopigmentation; Supervision of other normal pregnancy, antepartum; Hemorrhoids during pregnancy in second trimester; and Maternal excessive weight gain, third trimester on their problem list.  Patient reports no complaints.  Contractions: Not present. Vag. Bleeding: None.  Movement: Present. Denies leaking of fluid.   The following portions of the patient's history were reviewed and updated as appropriate: allergies, current medications, past family history, past medical history, past social history, past surgical history and problem list. Problem list updated.  Objective:   Vitals:   05/17/17 1434  BP: 103/65  Pulse: 96  Weight: 189 lb 12.8 oz (86.1 kg)    Fetal Status: Fetal Heart Rate (bpm): 136   Movement: Present     General:  Alert, oriented and cooperative. Patient is in no acute distress.  Skin: Skin is warm and dry. No rash noted.   Cardiovascular: Normal heart rate noted  Respiratory: Normal respiratory effort, no problems with respiration noted  Abdomen: Soft, gravid, appropriate for gestational age.  Pain/Pressure: Absent     Pelvic: Cervical exam deferred        Extremities: Normal range of motion.  Edema: None  Mental Status:  Normal mood and affect. Normal behavior. Normal judgment and thought content.   Assessment and Plan:  Pregnancy: G3P2002 at 4831w1d  1. Supervision of other normal pregnancy, antepartum   Preterm labor symptoms and general obstetric precautions including but not limited to vaginal bleeding, contractions, leaking of fluid and fetal movement were reviewed in detail with the patient. Please refer to After Visit Summary for other counseling recommendations.  Return in about 1 week (around 05/24/2017).   Allie BossierMyra C Dorenda Pfannenstiel, MD

## 2017-05-17 NOTE — Progress Notes (Signed)
ROB with no concerns today.   

## 2017-05-24 ENCOUNTER — Ambulatory Visit (INDEPENDENT_AMBULATORY_CARE_PROVIDER_SITE_OTHER): Payer: Medicaid Other | Admitting: Certified Nurse Midwife

## 2017-05-24 VITALS — BP 118/65 | HR 86 | Wt 191.0 lb

## 2017-05-24 DIAGNOSIS — Z3483 Encounter for supervision of other normal pregnancy, third trimester: Secondary | ICD-10-CM

## 2017-05-24 DIAGNOSIS — Z3480 Encounter for supervision of other normal pregnancy, unspecified trimester: Secondary | ICD-10-CM

## 2017-05-24 DIAGNOSIS — O2603 Excessive weight gain in pregnancy, third trimester: Secondary | ICD-10-CM

## 2017-05-24 NOTE — Progress Notes (Signed)
   PRENATAL VISIT NOTE  Subjective:  Christine Hodge is a 26 y.o. G3P2002 at 6913w1d being seen today for ongoing prenatal care.  She is currently monitored for the following issues for this low-risk pregnancy and has Hypopigmentation; Supervision of other normal pregnancy, antepartum; Hemorrhoids during pregnancy in second trimester; and Maternal excessive weight gain, third trimester on their problem list.  Patient reports no complaints.  Contractions: Not present. Vag. Bleeding: None.  Movement: Present. Denies leaking of fluid.   The following portions of the patient's history were reviewed and updated as appropriate: allergies, current medications, past family history, past medical history, past social history, past surgical history and problem list. Problem list updated.  Objective:   Vitals:   05/24/17 1517  BP: 118/65  Pulse: 86  Weight: 191 lb (86.6 kg)    Fetal Status: Fetal Heart Rate (bpm): 138; doppler Fundal Height: 39 cm Movement: Present     General:  Alert, oriented and cooperative. Patient is in no acute distress.  Skin: Skin is warm and dry. No rash noted.   Cardiovascular: Normal heart rate noted  Respiratory: Normal respiratory effort, no problems with respiration noted  Abdomen: Soft, gravid, appropriate for gestational age.  Pain/Pressure: Absent     Pelvic: Cervical exam deferred        Extremities: Normal range of motion.  Edema: None  Mental Status:  Normal mood and affect. Normal behavior. Normal judgment and thought content.   Assessment and Plan:  Pregnancy: G3P2002 at 6213w1d  1. Supervision of other normal pregnancy, antepartum     Doing well.   2. Maternal excessive weight gain, third trimester     82 lb weight gain this pregnancy.   Term labor symptoms and general obstetric precautions including but not limited to vaginal bleeding, contractions, leaking of fluid and fetal movement were reviewed in detail with the patient. Please refer to After  Visit Summary for other counseling recommendations.  Return in about 1 week (around 05/31/2017) for ROB.   Roe Coombsachelle A Lilian Fuhs, CNM

## 2017-05-24 NOTE — Progress Notes (Signed)
Patient reports good fetal movement, denies pain. 

## 2017-05-31 ENCOUNTER — Encounter: Payer: Self-pay | Admitting: Obstetrics

## 2017-05-31 ENCOUNTER — Ambulatory Visit (INDEPENDENT_AMBULATORY_CARE_PROVIDER_SITE_OTHER): Payer: Medicaid Other | Admitting: Obstetrics

## 2017-05-31 VITALS — BP 102/71 | HR 96 | Wt 193.0 lb

## 2017-05-31 DIAGNOSIS — Z3480 Encounter for supervision of other normal pregnancy, unspecified trimester: Secondary | ICD-10-CM

## 2017-05-31 DIAGNOSIS — Z3483 Encounter for supervision of other normal pregnancy, third trimester: Secondary | ICD-10-CM

## 2017-05-31 NOTE — Progress Notes (Signed)
ROB w/ no concerns today. Request cx check today.

## 2017-05-31 NOTE — Progress Notes (Signed)
Subjective:  Christine Hodge is a 26 y.o. G3P2002 at 7458w1d being seen today for ongoing prenatal care.  She is currently monitored for the following issues for this low-risk pregnancy and has Hypopigmentation; Supervision of other normal pregnancy, antepartum; Hemorrhoids during pregnancy in second trimester; and Maternal excessive weight gain, third trimester on their problem list.  Patient reports occasional contractions.  Contractions: Not present. Vag. Bleeding: None.  Movement: Present. Denies leaking of fluid.   The following portions of the patient's history were reviewed and updated as appropriate: allergies, current medications, past family history, past medical history, past social history, past surgical history and problem list. Problem list updated.  Objective:   Vitals:   05/31/17 1322  BP: 102/71  Pulse: 96  Weight: 193 lb (87.5 kg)    Fetal Status: Fetal Heart Rate (bpm): 150   Movement: Present     General:  Alert, oriented and cooperative. Patient is in no acute distress.  Skin: Skin is warm and dry. No rash noted.   Cardiovascular: Normal heart rate noted  Respiratory: Normal respiratory effort, no problems with respiration noted  Abdomen: Soft, gravid, appropriate for gestational age. Pain/Pressure: Absent     Pelvic:  Cvx:  2cm / 50 % / -3 / Vtx  Extremities: Normal range of motion.  Edema: None  Mental Status: Normal mood and affect. Normal behavior. Normal judgment and thought content.   Urinalysis:      Assessment and Plan:  Pregnancy: G3P2002 at 4958w1d  1. Supervision of other normal pregnancy, antepartum   Term labor symptoms and general obstetric precautions including but not limited to vaginal bleeding, contractions, leaking of fluid and fetal movement were reviewed in detail with the patient. Please refer to After Visit Summary for other counseling recommendations.  Return in about 1 week (around 06/07/2017) for ROB.   Brock BadHarper, Charles A, MD

## 2017-06-01 ENCOUNTER — Telehealth (HOSPITAL_COMMUNITY): Payer: Self-pay | Admitting: *Deleted

## 2017-06-01 ENCOUNTER — Encounter (HOSPITAL_COMMUNITY): Payer: Self-pay | Admitting: *Deleted

## 2017-06-01 NOTE — Telephone Encounter (Signed)
Preadmission screen interpreter [phone]

## 2017-06-05 NOTE — L&D Delivery Note (Signed)
Delivery Note At 11:14 PM a viable female was delivered via Vaginal, Spontaneous. Presentation:ROA.  APGAR: 9, 9; weight pending  .   Placenta status: delivered. 3v cord, body cord.  with the following complications: none.  Cord pH: n/a  Anesthesia:none   Episiotomy: None Lacerations: None Suture Repair: none Est. Blood Loss (mL): 50  Mom to postpartum.  Baby to Couplet care / Skin to Skin.  [name],SNM 06/13/2017, 11:43 PM  Patient is a [id number] at 4840w0d who was admitted for IOL due to postdates, essentially uncomplicated prenatal course.  She progressed with augmentation via cytotec x 1, Pit/AROM.  I was gloved and present for initial pushing, then I stepped into another PP pt's room to eval for lac repair. This baby rotated and delivered just prior to me returning to the room. Second stage of labor progressed, baby delivered after pushing with a few contractions.  No decels during second stage noted.  Complications: none  Lacerations: sm L periurethral abrasion, not repaired  EBL: 50cc  Brodie Correll, CNM 12:17 AM  Please schedule this patient for Postpartum visit in: 4 weeks with the following provider: Any provider For C/S patients schedule nurse incision check in weeks 2 weeks: no Low risk pregnancy complicated by: none Delivery mode:  SVD Anticipated Birth Control:  Depo PP Procedures needed: none  Schedule Integrated BH visit: no

## 2017-06-07 ENCOUNTER — Encounter: Payer: Self-pay | Admitting: Obstetrics

## 2017-06-07 ENCOUNTER — Ambulatory Visit (INDEPENDENT_AMBULATORY_CARE_PROVIDER_SITE_OTHER): Payer: Medicaid Other | Admitting: Obstetrics

## 2017-06-07 VITALS — BP 114/72 | HR 95 | Wt 190.0 lb

## 2017-06-07 DIAGNOSIS — Z3483 Encounter for supervision of other normal pregnancy, third trimester: Secondary | ICD-10-CM | POA: Diagnosis not present

## 2017-06-07 DIAGNOSIS — Z3480 Encounter for supervision of other normal pregnancy, unspecified trimester: Secondary | ICD-10-CM

## 2017-06-07 DIAGNOSIS — O480 Post-term pregnancy: Secondary | ICD-10-CM

## 2017-06-07 NOTE — Progress Notes (Signed)
Pt requesting to have cx checked.

## 2017-06-07 NOTE — Progress Notes (Signed)
Subjective:  Christine Hodge is a 27 y.o. G3P2002 at 1987w1d being seen today for ongoing prenatal care.  She is currently monitored for the following issues for this low-risk pregnancy and has Hypopigmentation; Supervision of other normal pregnancy, antepartum; Hemorrhoids during pregnancy in second trimester; and Maternal excessive weight gain, third trimester on their problem list.  Patient reports no complaints.  Contractions: Not present. Vag. Bleeding: None.  Movement: Present. Denies leaking of fluid.   The following portions of the patient's history were reviewed and updated as appropriate: allergies, current medications, past family history, past medical history, past social history, past surgical history and problem list. Problem list updated.  Objective:   Vitals:   06/07/17 1600  BP: 114/72  Pulse: 95  Weight: 190 lb (86.2 kg)    Fetal Status:     Movement: Present     General:  Alert, oriented and cooperative. Patient is in no acute distress.  Skin: Skin is warm and dry. No rash noted.   Cardiovascular: Normal heart rate noted  Respiratory: Normal respiratory effort, no problems with respiration noted  Abdomen: Soft, gravid, appropriate for gestational age. Pain/Pressure: Absent     Pelvic:  Cervical exam performed      2 cm / 50% / -3 / Vtx  Extremities: Normal range of motion.  Edema: None  Mental Status: Normal mood and affect. Normal behavior. Normal judgment and thought content.   Urinalysis:      NST:  Reactive.  Good variability.  Baseline FHR 150's.  15x15 accels.  No decels  Assessment and Plan:  Pregnancy: G3P2002 at 5787w1d  1. Supervision of other normal pregnancy, antepartum - IOL scheduled for 1 - 9 - 2019  Term labor symptoms and general obstetric precautions including but not limited to vaginal bleeding, contractions, leaking of fluid and fetal movement were reviewed in detail with the patient. Please refer to After Visit Summary for other counseling  recommendations.  Return in about 5 weeks (around 07/12/2017) for postpartum.   Brock BadHarper, Rooney Swails A, MD

## 2017-06-08 ENCOUNTER — Other Ambulatory Visit: Payer: Self-pay | Admitting: Advanced Practice Midwife

## 2017-06-13 ENCOUNTER — Inpatient Hospital Stay (HOSPITAL_COMMUNITY)
Admission: RE | Admit: 2017-06-13 | Discharge: 2017-06-15 | DRG: 806 | Disposition: A | Discharge status: Home / Self Care | Payer: Medicaid Other | Source: Ambulatory Visit | Attending: Obstetrics and Gynecology | Admitting: Obstetrics and Gynecology

## 2017-06-13 ENCOUNTER — Encounter (HOSPITAL_COMMUNITY): Payer: Self-pay

## 2017-06-13 DIAGNOSIS — O480 Post-term pregnancy: Principal | ICD-10-CM | POA: Diagnosis present

## 2017-06-13 DIAGNOSIS — O2603 Excessive weight gain in pregnancy, third trimester: Secondary | ICD-10-CM | POA: Diagnosis present

## 2017-06-13 DIAGNOSIS — Z3A41 41 weeks gestation of pregnancy: Secondary | ICD-10-CM

## 2017-06-13 DIAGNOSIS — O2243 Hemorrhoids in pregnancy, third trimester: Secondary | ICD-10-CM | POA: Diagnosis present

## 2017-06-13 LAB — CBC
HEMATOCRIT: 36.7 % (ref 36.0–46.0)
Hemoglobin: 12.2 g/dL (ref 12.0–15.0)
MCH: 27.4 pg (ref 26.0–34.0)
MCHC: 33.2 g/dL (ref 30.0–36.0)
MCV: 82.5 fL (ref 78.0–100.0)
PLATELETS: 167 10*3/uL (ref 150–400)
RBC: 4.45 MIL/uL (ref 3.87–5.11)
RDW: 15.4 % (ref 11.5–15.5)
WBC: 6.1 10*3/uL (ref 4.0–10.5)

## 2017-06-13 LAB — ABO/RH: ABO/RH(D): A POS

## 2017-06-13 LAB — TYPE AND SCREEN
ABO/RH(D): A POS
ANTIBODY SCREEN: NEGATIVE

## 2017-06-13 LAB — RPR: RPR Ser Ql: NONREACTIVE

## 2017-06-13 MED ORDER — IBUPROFEN 600 MG PO TABS
600.0000 mg | ORAL_TABLET | Freq: Four times a day (QID) | ORAL | Status: DC
  Administered 2017-06-14 – 2017-06-15 (×5): 600 mg via ORAL
  Filled 2017-06-13 (×5): qty 1

## 2017-06-13 MED ORDER — ONDANSETRON HCL 4 MG/2ML IJ SOLN
4.0000 mg | Freq: Four times a day (QID) | INTRAMUSCULAR | Status: DC | PRN
Start: 2017-06-13 — End: 2017-06-14

## 2017-06-13 MED ORDER — LIDOCAINE HCL (PF) 1 % IJ SOLN
30.0000 mL | INTRAMUSCULAR | Status: DC | PRN
  Filled 2017-06-13: qty 30

## 2017-06-13 MED ORDER — LACTATED RINGERS IV SOLN: 500.0000 mL | INTRAVENOUS | Status: DC | PRN

## 2017-06-13 MED ORDER — OXYTOCIN 40 UNITS IN LACTATED RINGERS INFUSION - SIMPLE MED
1.0000 m[IU]/min | INTRAVENOUS | Status: DC
  Administered 2017-06-13: 2 m[IU]/min via INTRAVENOUS
  Filled 2017-06-13: qty 1000

## 2017-06-13 MED ORDER — OXYTOCIN 40 UNITS IN LACTATED RINGERS INFUSION - SIMPLE MED: 2.5000 [IU]/h | INTRAVENOUS | Status: DC

## 2017-06-13 MED ORDER — TERBUTALINE SULFATE 1 MG/ML IJ SOLN
0.2500 mg | Freq: Once | INTRAMUSCULAR | Status: DC | PRN
  Filled 2017-06-13: qty 1

## 2017-06-13 MED ORDER — OXYCODONE-ACETAMINOPHEN 5-325 MG PO TABS: 2.0000 | ORAL_TABLET | ORAL | Status: DC | PRN

## 2017-06-13 MED ORDER — LACTATED RINGERS IV SOLN
INTRAVENOUS | Status: DC
  Administered 2017-06-13: 16:00:00 via INTRAVENOUS

## 2017-06-13 MED ORDER — OXYCODONE-ACETAMINOPHEN 5-325 MG PO TABS: 1.0000 | ORAL_TABLET | ORAL | Status: DC | PRN

## 2017-06-13 MED ORDER — ACETAMINOPHEN 325 MG PO TABS: 650.0000 mg | ORAL_TABLET | ORAL | Status: DC | PRN

## 2017-06-13 MED ORDER — OXYTOCIN BOLUS FROM INFUSION
500.0000 mL | Freq: Once | INTRAVENOUS | Status: AC
  Administered 2017-06-13: 500 mL via INTRAVENOUS

## 2017-06-13 MED ORDER — MISOPROSTOL 50MCG HALF TABLET
50.0000 ug | ORAL_TABLET | ORAL | Status: DC | PRN
  Administered 2017-06-13: 50 ug via ORAL
  Filled 2017-06-13 (×2): qty 1

## 2017-06-13 MED ORDER — FENTANYL CITRATE (PF) 100 MCG/2ML IJ SOLN
100.0000 ug | INTRAMUSCULAR | Status: DC | PRN
  Administered 2017-06-13: 100 ug via INTRAVENOUS
  Filled 2017-06-13 (×2): qty 2

## 2017-06-13 MED ORDER — SOD CITRATE-CITRIC ACID 500-334 MG/5ML PO SOLN: 30.0000 mL | ORAL | Status: DC | PRN

## 2017-06-13 MED ORDER — MISOPROSTOL 25 MCG QUARTER TABLET: 25.0000 ug | ORAL_TABLET | ORAL | Status: DC | PRN

## 2017-06-13 NOTE — Anesthesia Pain Management Evaluation Note (Signed)
  CRNA Pain Management Visit Note  Patient: Christine Hodge, 27 y.o., female  "Hello I am a member of the anesthesia team at [facility]. We have an anesthesia team available at all times to provide care throughout the hospital, including epidural management and anesthesia for C-section. I don't know your plan for the delivery whether it a natural birth, water birth, IV sedation, nitrous supplementation, doula or epidural, but we want to meet your pain goals."   1.Was your pain managed to your expectations on prior hospitalizations?   Yes   2.What is your expectation for pain management during this hospitalization?     Labor support without medications  3.How can we help you reach that goal? unsure  Record the patient's initial score and the patient's pain goal.   Pain: 0  Pain Goal: 7 [facility] wants you to be able to say your pain was always managed very well.  Cephus ShellingBURGER,Jeanee Fabre 06/13/2017

## 2017-06-13 NOTE — Progress Notes (Signed)
Christine Hodge is a 27 y.o. G3P2002 at 7168w0d by ultrasound admitted for induction of labor due to Post dates.  Subjective: Patient resting comfortably. States she still can't feel her contractions very strongly.   Objective: BP (!) 91/50   Pulse 90   Temp 97.9 F (36.6 C)   Resp 20   Ht 5\' 6"  (1.676 m)   LMP 07/30/2016   BMI 30.67 kg/m  No intake/output data recorded. No intake/output data recorded.  FHT:  FHR: 120 bpm, variability: moderate,  accelerations:  Present,  decelerations:  Present early UC:   irregular, every 2-3 minutes SVE:   Dilation: 6 Effacement (%): 80 Station: -1 Exam by:: Dr Rachelle HoraMoss   Labs: Lab Results  Component Value Date   WBC 6.1 06/13/2017   HGB 12.2 06/13/2017   HCT 36.7 06/13/2017   MCV 82.5 06/13/2017   PLT 167 06/13/2017    Assessment / Plan: Induction of labor due to postterm,  progressing well on pitocin  Labor: Progressing on pitocin, will continue to increase. Attempted AROM by both myself and Dr. Rachelle HoraMoss. Unclear if complete AROM Fetal Wellbeing:  Category I Pain Control:  Labor support without medications I/D:  n/a Anticipated MOD:  NSVD  Christine Hodge 06/13/2017, 4:48 PM

## 2017-06-13 NOTE — Progress Notes (Signed)
Received report from previous nurse and assumed care of patient

## 2017-06-13 NOTE — H&P (Signed)
LABOR AND DELIVERY ADMISSION HISTORY AND PHYSICAL NOTE  Christine Hodge is a 27 y.o. female G17P2002 with IUP at [redacted]w[redacted]d by ultrasound presenting for induction of labor for post-dates.  She reports positive fetal movement. She denies leakage of fluid or vaginal bleeding.  Prenatal History/Complications: [facility] at [facility]  Pregnancy complications:  - hemorrhoids  - maternal excessive weight gain   Past Medical History: Past Medical History:  Diagnosis Date  . Medical history non-contributory     Past Surgical History: Past Surgical History:  Procedure Laterality Date  . NO PAST SURGERIES      Obstetrical History: OB History    Gravida Para Term Preterm AB Living   3 2 2  0 0 2   SAB TAB Ectopic Multiple Live Births   0 0 0 0 2      Social History: Social History   Socioeconomic History  . Marital status: Married    Spouse name: None  . Number of children: None  . Years of education: None  . Highest education level: None  Social Needs  . Financial resource strain: None  . Food insecurity - worry: None  . Food insecurity - inability: None  . Transportation needs - medical: None  . Transportation needs - non-medical: None  Occupational History  . None  Tobacco Use  . Smoking status: Never Smoker  . Smokeless tobacco: Never Used  Substance and Sexual Activity  . Alcohol use: No    Alcohol/week: 0.0 oz  . Drug use: No  . Sexual activity: Yes  Other Topics Concern  . None  Social History Narrative   Immigrant Social History:   - Date arrived in [place]: Sept 2, 2015   - Language: Speaks English. Also speaks [place] and [place].   - Education: Highest level of education: no formal education   - Tobacco/alcohol/drug use: denies   - Marriage Status: married   - Sexual activity: vaginal   - Class A/B conditions: none   - denies history of torture.     Family History: Family History  Problem Relation Age of Onset  . Diabetes Mother      Allergies: No Known Allergies  Medications Prior to Admission  Medication Sig Dispense Refill Last Dose  . acetaminophen (TYLENOL) 325 MG tablet Take 650 mg every 6 (six) hours as needed by mouth for headache.   Taking  . Prenat-FeAsp-Meth-FA-DHA w/o A (PRENATE PIXIE) 10-0.6-0.4-200 MG CAPS Take 1 tablet by mouth daily. 30 capsule 12 Taking  . terconazole (TERAZOL 3) 0.8 % vaginal cream Place 1 applicator vaginally at bedtime. (Patient not taking: Reported on 05/24/2017) 20 g 0 Not Taking     Review of Systems  All systems reviewed and negative except as stated in HPI  Physical Exam Temperature 98.1 F (36.7 C), height 5\' 6"  (1.676 m), last menstrual period 07/30/2016. General appearance: alert, oriented, NAD Lungs: normal respiratory effort Heart: regular rate Abdomen: soft, non-tender; gravid, FH appropriate for GA Extremities: No calf swelling or tenderness Presentation: cephalic Fetal monitoring: FHR 135, moderate variability, +accel, +early decel  Cervix: dilated 1 cm  Uterine activity: irregular, q1-3 min     Prenatal labs: ABO, Rh: A/Positive/-- (07/30 1522) Antibody: Negative (07/30 1522) Rubella: 5.73 (07/30 1522) RPR: Non Reactive (10/08 1152)  HBsAg: Negative (07/30 1522)  HIV: Non Reactive (10/08 1152)  GC/Chlamydia: Negative GBS: Negative (12/04 1619)  2-hr GTT: 128 Genetic screening:  Mat 21 normal  Anatomy US: normal, female fetus   Prenatal Transfer  Tool  Maternal Diabetes: No Genetic Screening: Normal Maternal Ultrasounds/Referrals: Normal Fetal Ultrasounds or other Referrals:  None Maternal Substance Abuse:  No Significant Maternal Medications:  None Significant Maternal Lab Results: Lab values include: Group B Strep negative  Results for orders placed or performed during the hospital encounter of 06/13/17 (from the past 24 hour(s))  CBC   Collection Time: 06/13/17  8:20 AM  Result Value Ref Range   WBC 6.1 4.0 - 10.5 K/uL   RBC 4.45 3.87  - 5.11 MIL/uL   Hemoglobin 12.2 12.0 - 15.0 g/dL   HCT 96.036.7 45.436.0 - 09.846.0 %   MCV 82.5 78.0 - 100.0 fL   MCH 27.4 26.0 - 34.0 pg   MCHC 33.2 30.0 - 36.0 g/dL   RDW 11.915.4 14.711.5 - 82.915.5 %   Platelets 167 150 - 400 K/uL    Patient Active Problem List   Diagnosis Date Noted  . Post term pregnancy at [redacted] weeks gestation 06/13/2017  . Maternal excessive weight gain, third trimester 04/04/2017  . Hemorrhoids during pregnancy in second trimester 02/12/2017  . Supervision of other normal pregnancy, antepartum 01/01/2017  . Hypopigmentation 07/30/2015    Assessment: Christine Hodge is a 27 y.o. G3P2002 at 4410w0d here for IOL for post dates. No complaints or questions at this time.   #Labor: IOL with cytotec, will add pitocin as needed  #Pain: Does not want epidural  #FWB: Category I  #ID:  N/a GBS neg  #MOF: breast and bottle  #MOC:depo-provera  #Circ:  N/a (having a girl)   Christine Hodge 06/13/2017, 8:48 AM

## 2017-06-13 NOTE — Progress Notes (Signed)
Christine Hodge is a 27 y.o. G3P2002 at 8463w0d by ultrasound admitted for induction of labor due to Post dates.  Subjective: Patient resting comfortably. Patient with no questions or concerns. Denies fluid leakage or bleeding.   Objective: BP 114/78   Pulse 85   Temp 97.9 F (36.6 C)   Resp 20   Ht 5\' 6"  (1.676 m)   LMP 07/30/2016   BMI 30.67 kg/m  No intake/output data recorded. No intake/output data recorded.  FHT:  FHR: 160 bpm, variability: moderate,  accelerations:  Abscent,  decelerations:  Present 3 min late, now variable UC:   irregular, every 8 minutes SVE:  Dilation 4cm  Exam by : Randa LynnWilkins/Tamy Accardo  Labs: Lab Results  Component Value Date   WBC 6.1 06/13/2017   HGB 12.2 06/13/2017   HCT 36.7 06/13/2017   MCV 82.5 06/13/2017   PLT 167 06/13/2017    Assessment / Plan: Induction of labor due to postterm,  progressing well on pitocin  Labor: Progressing normally, will add Pitocin  Fetal Wellbeing:  Category II Pain Control:  Labor support without medications I/D:  n/a Anticipated MOD:  NSVD  Archit Leger 06/13/2017, 1:26 PM

## 2017-06-14 ENCOUNTER — Encounter (HOSPITAL_COMMUNITY): Payer: Self-pay

## 2017-06-14 MED ORDER — SIMETHICONE 80 MG PO CHEW: 80.0000 mg | CHEWABLE_TABLET | ORAL | Status: DC | PRN

## 2017-06-14 MED ORDER — ONDANSETRON HCL 4 MG PO TABS: 4.0000 mg | ORAL_TABLET | ORAL | Status: DC | PRN

## 2017-06-14 MED ORDER — DIPHENHYDRAMINE HCL 25 MG PO CAPS: 25.0000 mg | ORAL_CAPSULE | Freq: Four times a day (QID) | ORAL | Status: DC | PRN

## 2017-06-14 MED ORDER — COCONUT OIL OIL
1.0000 | TOPICAL_OIL | Status: DC | PRN
Start: 2017-06-14 — End: 2017-06-15

## 2017-06-14 MED ORDER — ACETAMINOPHEN 325 MG PO TABS: 650.0000 mg | ORAL_TABLET | ORAL | Status: DC | PRN

## 2017-06-14 MED ORDER — WITCH HAZEL-GLYCERIN EX PADS: 1.0000 "application " | MEDICATED_PAD | CUTANEOUS | Status: DC | PRN

## 2017-06-14 MED ORDER — BENZOCAINE-MENTHOL 20-0.5 % EX AERO: 1.0000 "application " | INHALATION_SPRAY | CUTANEOUS | Status: DC | PRN

## 2017-06-14 MED ORDER — ONDANSETRON HCL 4 MG/2ML IJ SOLN: 4.0000 mg | INTRAMUSCULAR | Status: DC | PRN

## 2017-06-14 MED ORDER — DIBUCAINE 1 % RE OINT: 1.0000 "application " | TOPICAL_OINTMENT | RECTAL | Status: DC | PRN

## 2017-06-14 MED ORDER — OXYCODONE HCL 5 MG PO TABS
5.0000 mg | ORAL_TABLET | ORAL | Status: DC | PRN
  Administered 2017-06-14: 5 mg via ORAL
  Filled 2017-06-14: qty 1

## 2017-06-14 MED ORDER — PRENATAL MULTIVITAMIN CH
1.0000 | ORAL_TABLET | Freq: Every day | ORAL | Status: DC
  Administered 2017-06-14: 1 via ORAL
  Filled 2017-06-14: qty 1

## 2017-06-14 MED ORDER — SENNOSIDES-DOCUSATE SODIUM 8.6-50 MG PO TABS
2.0000 | ORAL_TABLET | ORAL | Status: DC
  Administered 2017-06-14 – 2017-06-15 (×2): 2 via ORAL
  Filled 2017-06-14 (×2): qty 2

## 2017-06-14 MED ORDER — TETANUS-DIPHTH-ACELL PERTUSSIS 5-2.5-18.5 LF-MCG/0.5 IM SUSP
0.5000 mL | Freq: Once | INTRAMUSCULAR | Status: AC
  Administered 2017-06-14: 0.5 mL via INTRAMUSCULAR
  Filled 2017-06-14: qty 0.5

## 2017-06-14 MED ORDER — ZOLPIDEM TARTRATE 5 MG PO TABS: 5.0000 mg | ORAL_TABLET | Freq: Every evening | ORAL | Status: DC | PRN

## 2017-06-14 NOTE — Progress Notes (Signed)
Post Partum Day 1  Subjective: no complaints, up ad lib, voiding and tolerating PO  Objective: Vitals:   06/14/17 0345 06/14/17 0600  BP: 123/67 110/82  Pulse:  73  Resp:  18  Temp:  98.2 F (36.8 C)    Breastfeeding: well  Physical Exam:  General: alert, cooperative and fatigued Lochia: appropriate Uterine Fundus: firm Incision: n/a DVT Evaluation: No evidence of DVT seen on physical exam.  CBC    Component Value Date/Time   WBC 6.1 06/13/2017 0820   RBC 4.45 06/13/2017 0820   HGB 12.2 06/13/2017 0820   HGB 11.5 03/12/2017 1152   HGB 9.9 10/18/2016   HCT 36.7 06/13/2017 0820   HCT 33.8 (L) 03/12/2017 1152   HCT 31 10/18/2016   PLT 167 06/13/2017 0820   PLT 182 03/12/2017 1152   PLT 244 10/18/2016   MCV 82.5 06/13/2017 0820   MCV 83 03/12/2017 1152   MCH 27.4 06/13/2017 0820   MCHC 33.2 06/13/2017 0820   RDW 15.4 06/13/2017 0820   RDW 16.1 (H) 03/12/2017 1152   LYMPHSABS 2.3 04/16/2017 1458   LYMPHSABS 2.2 01/01/2017 1522   MONOABS 0.5 04/16/2017 1458   EOSABS 0.3 04/16/2017 1458   EOSABS 0.3 01/01/2017 1522   BASOSABS 0.0 04/16/2017 1458   BASOSABS 0.0 01/01/2017 1522   Assessment/Plan: Plan for discharge tomorrow, Breastfeeding and Contraception Depo   LOS: 2 day

## 2017-06-14 NOTE — Lactation Note (Signed)
This note was copied from a baby's chart. Lactation Consultation Note  Patient Name: Christine Hodge ZOXWR'UToday's Date: 06/14/2017 Reason for consult: Initial assessment  11 hours 41w 0d female who is being exclusively BF by her mother at this point. No need to use interpreter services, she is bilingual in [place] and [place]. Mom has been taught how to hand express already and she's also an experienced BF mom. Breast status seemed intact per mom feedings are going well withoutl any pain or discomfort. Discussed BF basics and stressed the importance of feeding baby on cues instead of watching the clock. STS was also approached, baby was down to her diaper right after her bath. Mom is aware of OP services and she was also invited to join the BF group on Tuesday mornings.  Maternal Data Formula Feeding for Exclusion: No Has patient been taught Hand Expression?: Yes Does the patient have breastfeeding experience prior to this delivery?: Yes  Feeding Feeding Type: Breast Fed Length of feed: 20 min    Interventions Interventions: Breast feeding basics reviewed;Skin to skin;Hand express  Consult Status Consult Status: Follow-up Date: 06/15/17 Follow-up type: In-patient    Christine Hodge Venetia ConstableS Missey Hasley 06/14/2017, 11:13 AM

## 2017-06-15 MED ORDER — IBUPROFEN 600 MG PO TABS: 600.0000 mg | ORAL_TABLET | Freq: Four times a day (QID) | ORAL | 0 refills | Status: DC

## 2017-06-15 NOTE — Discharge Summary (Signed)
OB Discharge Summary     Patient Name: Christine Hodge DOB: 1991/01/07 MRN: [id number]  Date of admission: 06/13/2017 Delivering MD:    Cam HaiKimberly Shaw, CNM  Date of discharge: 06/15/2017  Admitting diagnosis: INDUCTION Intrauterine pregnancy: 8459w0d     Secondary diagnosis:  Active Problems:   Post term pregnancy at [redacted] weeks gestation  Additional problems: None     Discharge diagnosis: Post Term Pregnancy                                                                                                Post partum procedures:None  Augmentation: AROM, Pitocin and Cytotec  Complications: None EBL: 50cc.  Hospital course:  Induction of Labor With Vaginal Delivery   27 y.o. yo G3P3003 at 5159w0d was admitted to the hospital 06/13/2017 for induction of labor.  Indication for induction: Postdates.  Patient had an uncomplicated labor course as follows: Membrane Rupture Time/Date: 4:38 PM ,06/13/2017   Intrapartum Procedures: Episiotomy: None [1]                                         Lacerations:  None [1]  Patient had delivery of a Viable infant.  Information for the patient's newborn:  Tanna FurryFelisberto, Girl Lugene [[id number]][[id number]]  Delivery Method: Vaginal, Spontaneous(Filed from Delivery Summary)   06/13/2017  Details of delivery can be found in separate delivery note.  Patient had a routine postpartum course. Patient is discharged home 06/15/17.  Physical exam  Vitals:   06/14/17 1400 06/14/17 1830 06/15/17 0520 06/15/17 0651  BP: 107/63 110/62 (!) 119/51   Pulse: 75 70 64   Resp: 16 18 18    Temp: 98.3 F (36.8 C) 98.1 F (36.7 C) 98.2 F (36.8 C)   TempSrc: Oral Oral Oral   SpO2: 100%     Weight:    82.1 kg (181 lb)  Height:       General: alert, cooperative and no distress CV: Regular Rate and rhythm. No murmurs. Lungs: CTAB without wheezing or rales.  Lochia: appropriate Uterine Fundus: firm Incision: N/A DVT Evaluation: No evidence of DVT seen on physical exam. Negative Homan's  sign. No cords or calf tenderness. No significant calf/ankle edema.  Labs: Lab Results  Component Value Date   WBC 6.1 06/13/2017   HGB 12.2 06/13/2017   HCT 36.7 06/13/2017   MCV 82.5 06/13/2017   PLT 167 06/13/2017   CMP Latest Ref Rng & Units 04/16/2017  Glucose 65 - 99 mg/dL 782(N108(H)  BUN 6 - 20 mg/dL 5(L)  Creatinine 5.620.44 - 1.00 mg/dL 1.300.50  Sodium 865135 - 784145 mmol/L 135  Potassium 3.5 - 5.1 mmol/L 3.2(L)  Chloride 101 - 111 mmol/L 104  CO2 22 - 32 mmol/L 24  Calcium 8.9 - 10.3 mg/dL 6.9(G8.5(L)  Total Protein 6.5 - 8.1 g/dL 6.8  Total Bilirubin 0.3 - 1.2 mg/dL 0.3  Alkaline Phos 38 - 126 U/L 75  AST 15 - 41 U/L 19  ALT 14 - 54 U/L 15  Discharge instruction: per After Visit Summary and "Baby and Me Booklet".  After visit meds:   Allergies as of 06/15/2017   No Known Allergies     Medication List    STOP taking these medications   PRENATE PIXIE 10-0.6-0.4-200 MG Caps     TAKE these medications   acetaminophen 325 MG tablet Commonly known as:  TYLENOL Take 650 mg every 6 (six) hours as needed by mouth for headache.   ibuprofen 600 MG tablet Commonly known as:  ADVIL,MOTRIN Take 1 tablet (600 mg total) by mouth every 6 (six) hours.   terconazole 0.8 % vaginal cream Commonly known as:  TERAZOL 3 Place 1 applicator vaginally at bedtime.       Diet: routine diet  Activity: Advance as tolerated. Pelvic rest for 6 weeks.   Outpatient follow up: 4 weeks  Follow up Appt: Non on File  Future Appointments  Date Time Provider Department Center  07/11/2017  2:30 PM Brock Bad, MD [facility] None   Follow up Visit:No Follow-up on file.  Postpartum contraception: Depo Provera  Newborn Data: Live born female  Birth Weight: 8 lb 10.8 oz (3935 g) APGAR: 9, 9  Newborn Delivery   Birth date/time:  06/13/2017 23:14:00 Delivery type:  Vaginal, Spontaneous     Baby Feeding: Bottle and Breast Disposition:home with mother   06/15/2017 Asa Saunas,  Student-PA

## 2017-06-15 NOTE — Lactation Note (Signed)
This note was copied from a baby's chart. Lactation Consultation Note  Patient Name: Girl Magnus Ivanlinda Morency ZOXWR'UToday's Date: 06/15/2017  Baby feeding frequently and well per mom. No questions or concerns at present.  Reviewed lactation outpatient services and encouraged prn.   Maternal Data    Feeding    LATCH Score                   Interventions    Lactation Tools Discussed/Used     Consult Status      Huston FoleyMOULDEN, Cliford Sequeira S 06/15/2017, 9:56 AM

## 2017-07-11 ENCOUNTER — Ambulatory Visit: Payer: Medicaid Other | Admitting: Obstetrics

## 2017-07-20 ENCOUNTER — Ambulatory Visit: Payer: Medicaid Other | Admitting: Obstetrics

## 2017-08-08 ENCOUNTER — Emergency Department (HOSPITAL_COMMUNITY)
Admission: EM | Admit: 2017-08-08 | Discharge: 2017-08-08 | Disposition: A | Discharge status: Home / Self Care | Payer: Medicaid Other | Attending: Emergency Medicine | Admitting: Emergency Medicine

## 2017-08-08 ENCOUNTER — Encounter (HOSPITAL_COMMUNITY): Payer: Self-pay | Admitting: Emergency Medicine

## 2017-08-08 ENCOUNTER — Other Ambulatory Visit: Payer: Self-pay

## 2017-08-08 DIAGNOSIS — Z202 Contact with and (suspected) exposure to infections with a predominantly sexual mode of transmission: Secondary | ICD-10-CM

## 2017-08-08 LAB — URINALYSIS, ROUTINE W REFLEX MICROSCOPIC
BILIRUBIN URINE: NEGATIVE
Bacteria, UA: NONE SEEN
GLUCOSE, UA: NEGATIVE mg/dL
KETONES UR: NEGATIVE mg/dL
LEUKOCYTES UA: NEGATIVE
Nitrite: NEGATIVE
PROTEIN: NEGATIVE mg/dL
Specific Gravity, Urine: 1.019 (ref 1.005–1.030)
pH: 5 (ref 5.0–8.0)

## 2017-08-08 LAB — WET PREP, GENITAL
SPERM: NONE SEEN
Trich, Wet Prep: NONE SEEN
Yeast Wet Prep HPF POC: NONE SEEN

## 2017-08-08 LAB — GC/CHLAMYDIA PROBE AMP (~~LOC~~) NOT AT ARMC
CHLAMYDIA, DNA PROBE: NEGATIVE
Neisseria Gonorrhea: POSITIVE — AB

## 2017-08-08 MED ORDER — AZITHROMYCIN 250 MG PO TABS
1000.0000 mg | ORAL_TABLET | Freq: Once | ORAL | Status: AC
Start: 2017-08-08 — End: 2017-08-08
  Administered 2017-08-08: 1000 mg via ORAL
  Filled 2017-08-08: qty 4

## 2017-08-08 MED ORDER — LIDOCAINE HCL (PF) 1 % IJ SOLN
INTRAMUSCULAR | Status: AC
  Filled 2017-08-08: qty 5

## 2017-08-08 MED ORDER — CEFTRIAXONE SODIUM 250 MG IJ SOLR
250.0000 mg | Freq: Once | INTRAMUSCULAR | Status: AC
  Administered 2017-08-08: 250 mg via INTRAMUSCULAR
  Filled 2017-08-08: qty 250

## 2017-08-08 NOTE — Discharge Instructions (Addendum)
You have been treated today for an STD. As we discussed, you need to pump and dump your breast milk for the next 24-48 hours. You need to formula feed your child.   The test results with take 2-3 days to return. If there is an abnormal result, you will be notified. If you do not hear anything, that means the results were negative. You can also log on MyChart to see the results.   Your sexual partner needs to be treated too. Do not have sexual intercourse for the next 7 days and after your partner has been treated.   Follow-up with your primary care doctor in 2-4 days. If you do not have a primary care doctor, you can use one listed in the paperwork.   Return to the Emergency Department for any fever, abdominal pain, difficulty breathing, nausea/vomiting or any other worsening or concerning symptoms.

## 2017-08-08 NOTE — ED Triage Notes (Signed)
Pt reports her partner was treated for STD, needs to be checked.  Reports some burning when she urinates X3 days.

## 2017-08-08 NOTE — ED Provider Notes (Signed)
[facility] EMERGENCY DEPARTMENT Provider Note   CSN: [id number] Arrival date & time: 08/08/17  4098     History   Chief Complaint Chief Complaint  Patient presents with  . Exposure to STD    HPI Christine Hodge is a 27 y.o. female who presents for evaluation of possible exposure to STD.  Patient reports that her partner tested positive for gonorrhea 4 days ago.  He was treated at that time.  She has not had any sexual intercourse with him since receiving treatment.  Patient reports that she has had some dysuria with urination for the last 3 days but denies any vaginal discharge.  Patient reports that she is only currently sexually active with one partner.  They do not use any protection.  Patient denies any fevers, chest pain, difficulty breathing, abdominal pain, vomiting, vaginal bleeding, vaginal discharge.  The history is provided by the patient.    Past Medical History:  Diagnosis Date  . Medical history non-contributory     Patient Active Problem List   Diagnosis Date Noted  . Post term pregnancy at [redacted] weeks gestation 06/13/2017  . Maternal excessive weight gain, third trimester 04/04/2017  . Hemorrhoids during pregnancy in second trimester 02/12/2017  . Supervision of other normal pregnancy, antepartum 01/01/2017  . Hypopigmentation 07/30/2015    Past Surgical History:  Procedure Laterality Date  . NO PAST SURGERIES      OB History    Gravida Para Term Preterm AB Living   3 3 3  0 0 3   SAB TAB Ectopic Multiple Live Births   0 0 0 0 3       Home Medications    Prior to Admission medications   Medication Sig Start Date End Date Taking? Authorizing Provider  acetaminophen (TYLENOL) 325 MG tablet Take 650 mg every 6 (six) hours as needed by mouth for headache.    [provider]  ibuprofen (ADVIL,MOTRIN) 600 MG tablet Take 1 tablet (600 mg total) by mouth every 6 (six) hours. 06/15/17   Alroy Bailiff, MD  terconazole (TERAZOL  3) 0.8 % vaginal cream Place 1 applicator vaginally at bedtime. Patient not taking: Reported on 05/24/2017 05/11/17   Roe Coombs, CNM    Family History Family History  Problem Relation Age of Onset  . Diabetes Mother     Social History Social History   Tobacco Use  . Smoking status: Never Smoker  . Smokeless tobacco: Never Used  Substance Use Topics  . Alcohol use: No    Alcohol/week: 0.0 oz  . Drug use: No     Allergies   Patient has no known allergies.   Review of Systems Review of Systems  Constitutional: Negative for fever.  Respiratory: Negative for cough and shortness of breath.   Cardiovascular: Negative for chest pain.  Gastrointestinal: Negative for abdominal pain, nausea and vomiting.  Genitourinary: Positive for dysuria. Negative for hematuria, vaginal bleeding and vaginal discharge.  Neurological: Negative for headaches.     Physical Exam Updated Vital Signs BP (!) 132/99   Pulse 82   Temp 98 F (36.7 C) (Oral)   Resp 18   Ht 5\' 6"  (1.676 m)   SpO2 100%   BMI 29.21 kg/m   Physical Exam  Constitutional: She appears well-developed and well-nourished.  HENT:  Head: Normocephalic and atraumatic.  Eyes: Conjunctivae and EOM are normal. Right eye exhibits no discharge. Left eye exhibits no discharge. No scleral icterus.  Pulmonary/Chest: Effort normal.  Abdominal:  Abdomen is soft, non-distended, non-tender.   Genitourinary: Vagina normal. Cervix exhibits discharge. Cervix exhibits no motion tenderness. Right adnexum displays no mass and no tenderness. Left adnexum displays no mass and no tenderness.  Genitourinary Comments: The exam was performed with a chaperone present. Normal external female genitalia. No lesions, rash, or sores.  Small amount of yellow discharge noted.  There is some erythema noted to the cervix but no friability.  No CMT.  No adnexal mass, tenderness bilaterally.  Neurological: She is alert.  Skin: Skin is warm and dry.    Psychiatric: She has a normal mood and affect. Her speech is normal and behavior is normal.  Nursing note and vitals reviewed.    ED Treatments / Results  Labs (all labs ordered are listed, but only abnormal results are displayed) Labs Reviewed  WET PREP, GENITAL - Abnormal; Notable for the following components:      Result Value   Clue Cells Wet Prep HPF POC PRESENT (*)    WBC, Wet Prep HPF POC MANY (*)    All other components within normal limits  URINALYSIS, ROUTINE W REFLEX MICROSCOPIC - Abnormal; Notable for the following components:   Hgb urine dipstick SMALL (*)    Squamous Epithelial / LPF 0-5 (*)    All other components within normal limits  GC/CHLAMYDIA PROBE AMP ([place]    EKG  EKG Interpretation None       Radiology No results found.  Procedures Procedures (including critical care time)  Medications Ordered in ED Medications  lidocaine (PF) (XYLOCAINE) 1 % injection (not administered)  cefTRIAXone (ROCEPHIN) injection 250 mg (250 mg Intramuscular Given 08/08/17 0719)  azithromycin (ZITHROMAX) tablet 1,000 mg (1,000 mg Oral Given 08/08/17 0719)     Initial Impression / Assessment and Plan / ED Course  I have reviewed the triage vital signs and the nursing notes.  Pertinent labs & imaging results that were available during my care of the patient were reviewed by me and considered in my medical decision making (see chart for details).     27 y.o. F who presents for evaluation of possible STD exposure.  Patient reports that her partner was recently tested positive for gonorrhea and received treatment 4 days ago.  No sexual intercourse since then.  Has had some dysuria but otherwise denies any vaginal discharge. Patient is afebrile, non-toxic appearing, sitting comfortably on examination table. Vital signs reviewed and stable.  GU exam shows yellow discharge.  There is some erythema around the service but otherwise no friability.  No CMT.   Pelvic exam is not concerning for PID.  I discussed treatment options with patient.  Patient would like to go ahead and receive treatment today in the ED.  She does have a newborn and reports that she does have breast-feeding and half formula feeding.  I encourage patient to pump and dump for the next 24-48 hours after antibiotic treatments.   Wet prep positive for clue cells, WBC.  Patient does not have any white, malodorous discharge.  UA reviewed.  Presence of hemoglobin but no evidence of nitrates, leukocytes, infectious etiology.  Discussed results with patient.  Encourage pump and dump 12 breastmilk for the next 24-48 hours after antibiotic use.  Patient does formula feed her baby and states that this is not an issue.  Instructed patient to abstain from sexual intercourse for the next week.  Vital signs are stable. Patient had ample opportunity for questions and discussion. All patient's questions were  answered with full understanding. Strict return precautions discussed. Patient expresses understanding and agreement to plan.   Final Clinical Impressions(s) / ED Diagnoses   Final diagnoses:  STD exposure    ED Discharge Orders    None       Maxwell Caul, PA-C 08/08/17 4403    Zadie Rhine, MD 08/08/17 2307

## 2017-08-10 ENCOUNTER — Ambulatory Visit (INDEPENDENT_AMBULATORY_CARE_PROVIDER_SITE_OTHER): Payer: Medicaid Other | Admitting: Obstetrics

## 2017-08-10 ENCOUNTER — Encounter: Payer: Self-pay | Admitting: Obstetrics

## 2017-08-10 ENCOUNTER — Other Ambulatory Visit: Payer: Self-pay

## 2017-08-10 DIAGNOSIS — Z3202 Encounter for pregnancy test, result negative: Secondary | ICD-10-CM

## 2017-08-10 DIAGNOSIS — Z3009 Encounter for other general counseling and advice on contraception: Secondary | ICD-10-CM

## 2017-08-10 DIAGNOSIS — N946 Dysmenorrhea, unspecified: Secondary | ICD-10-CM

## 2017-08-10 DIAGNOSIS — Z3042 Encounter for surveillance of injectable contraceptive: Secondary | ICD-10-CM

## 2017-08-10 DIAGNOSIS — Z30013 Encounter for initial prescription of injectable contraceptive: Secondary | ICD-10-CM

## 2017-08-10 LAB — POCT URINE PREGNANCY: Preg Test, Ur: NEGATIVE

## 2017-08-10 MED ORDER — MEDROXYPROGESTERONE ACETATE 150 MG/ML IM SUSP
150.0000 mg | INTRAMUSCULAR | 4 refills | Status: DC
Start: 2017-08-10 — End: 2018-07-09

## 2017-08-10 MED ORDER — MEDROXYPROGESTERONE ACETATE 150 MG/ML IM SUSP
150.0000 mg | Freq: Once | INTRAMUSCULAR | Status: AC
  Administered 2017-08-10: 150 mg via INTRAMUSCULAR

## 2017-08-10 MED ORDER — IBUPROFEN 800 MG PO TABS: 800.0000 mg | ORAL_TABLET | Freq: Three times a day (TID) | ORAL | 5 refills | Status: DC | PRN

## 2017-08-10 NOTE — Progress Notes (Signed)
Delivered 8 weeks ago. First visit since delivery.  Not sexually active, wants DEPO.  UPT today is NEGATIVE.  Office Stock [place] tolerated [place]. Next DEPO 5/24-11/09/2017.  Administrations This Visit    medroxyPROGESTERone (DEPO-PROVERA) injection 150 mg    Admin Date 08/10/2017 Action Given Dose 150 mg Route Intramuscular Administered By Maretta BeesMcGlashan, Titus Drone J, RMA

## 2017-08-10 NOTE — Progress Notes (Signed)
Subjective:     Christine Hodge is a 27 y.o. female who presents for a postpartum visit. She is 8 weeks postpartum following a spontaneous vaginal delivery. I have fully reviewed the prenatal and intrapartum course. The delivery was at 41 gestational weeks. Outcome: spontaneous vaginal delivery. Anesthesia: none. Postpartum course has been normal. Baby's course has been normal. Baby is feeding by breast. Bleeding  spotting. Bowel function is normal. Bladder function is normal. Patient is sexually active. Contraception method is none. Postpartum depression screening: negative.  Tobacco, alcohol and substance abuse history reviewed.  Adult immunizations reviewed including TDAP, rubella and varicella.  The following portions of the patient's history were reviewed and updated as appropriate: allergies, current medications, past family history, past medical history, past social history, past surgical history and problem list.  Review of Systems A comprehensive review of systems was negative.   Objective:    BP 129/90   Pulse 65   Ht 5\' 6"  (1.676 m)   Wt 178 lb 8 oz (81 kg)   LMP 07/30/2017 (Approximate)   Breastfeeding? Yes   BMI 28.81 kg/m   General:  alert and no distress   Breasts:  inspection negative, no nipple discharge or bleeding, no masses or nodularity palpable  Lungs: clear to auscultation bilaterally  Heart:  regular rate and rhythm, S1, S2 normal, no murmur, click, rub or gallop  Abdomen: soft, non-tender; bowel sounds normal; no masses,  no organomegaly   Vulva:  normal  Vagina: normal vagina  Cervix:  no cervical motion tenderness  Corpus: normal size, contour, position, consistency, mobility, non-tender  Adnexa:  no mass, fullness, tenderness  Rectal Exam: Not performed.          50% of 20 min visit spent on counseling and coordination of care.   Assessment:      1. Postpartum care following vaginal delivery - doing well  2. Encounter for other general counseling  and advice on contraception - wants Depo Provera  3. Encounter for initial prescription of injectable contraceptive Rx: - POCT urine pregnancy - medroxyPROGESTERone (DEPO-PROVERA) 150 MG/ML injection; Inject 1 mL (150 mg total) into the muscle every 3 (three) months.  Dispense: 1 mL; Refill: 4  4. Dysmenorrhea Rx: - Ibuprofen 800 mg po q 8 hours prn, # 30, 5 refills   Plan:    1. Contraception: Depo-Provera injections 2. Continue PNV's 3. Follow up in: 3 months or as needed.     Brock BadHARLES A. Trenisha Lafavor MD

## 2017-11-06 ENCOUNTER — Ambulatory Visit: Payer: Medicaid Other | Admitting: Certified Nurse Midwife

## 2017-11-09 ENCOUNTER — Ambulatory Visit (INDEPENDENT_AMBULATORY_CARE_PROVIDER_SITE_OTHER): Payer: Medicaid Other | Admitting: Certified Nurse Midwife

## 2017-11-09 ENCOUNTER — Other Ambulatory Visit (HOSPITAL_COMMUNITY)
Admission: RE | Admit: 2017-11-09 | Discharge: 2017-11-09 | Disposition: A | Discharge status: Home / Self Care | Payer: Medicaid Other | Source: Ambulatory Visit | Attending: Certified Nurse Midwife | Admitting: Certified Nurse Midwife

## 2017-11-09 ENCOUNTER — Encounter: Payer: Self-pay | Admitting: Certified Nurse Midwife

## 2017-11-09 VITALS — BP 124/77 | HR 74 | Ht 66.0 in | Wt 193.3 lb

## 2017-11-09 DIAGNOSIS — Z202 Contact with and (suspected) exposure to infections with a predominantly sexual mode of transmission: Secondary | ICD-10-CM | POA: Insufficient documentation

## 2017-11-09 DIAGNOSIS — Z01419 Encounter for gynecological examination (general) (routine) without abnormal findings: Secondary | ICD-10-CM | POA: Diagnosis not present

## 2017-11-09 DIAGNOSIS — Z0000 Encounter for general adult medical examination without abnormal findings: Secondary | ICD-10-CM | POA: Diagnosis not present

## 2017-11-09 DIAGNOSIS — Z3042 Encounter for surveillance of injectable contraceptive: Secondary | ICD-10-CM

## 2017-11-09 DIAGNOSIS — Z23 Encounter for immunization: Secondary | ICD-10-CM

## 2017-11-09 DIAGNOSIS — Z30011 Encounter for initial prescription of contraceptive pills: Secondary | ICD-10-CM

## 2017-11-09 MED ORDER — MEDROXYPROGESTERONE ACETATE 150 MG/ML IM SUSP
150.0000 mg | INTRAMUSCULAR | Status: DC
  Administered 2017-11-09: 150 mg via INTRAMUSCULAR

## 2017-11-09 MED ORDER — NORETHINDRONE 0.35 MG PO TABS: 1.0000 | ORAL_TABLET | Freq: Every day | ORAL | 11 refills | Status: DC

## 2017-11-09 NOTE — Progress Notes (Signed)
Pt is here for annual gyn exam. Last pap 07/30/15 ASCUS +HPV. Pt is doing depo for contraception. Pap smear today.

## 2017-11-09 NOTE — Progress Notes (Signed)
Subjective:        Christine Hodge is a 27 y.o. female here for a routine exam.  Current complaints: irregular bleeding spotting with Depo provera injections, currently spotting. Hx of abnormal pap smear: colposcopy needed after visual exam of cervix ?leision at 6 o'clock.  Denies any change in vaginal discharge.  Declines STD screening exam.      Personal health questionnaire:  Is patient Ashkenazi Jewish, have a family history of breast and/or ovarian cancer: no Is there a family history of uterine cancer diagnosed at age < 40, gastrointestinal cancer, urinary tract cancer, family member who is a Personnel officer syndrome-associated carrier: no Is the patient overweight and hypertensive, family history of diabetes, personal history of gestational diabetes, preeclampsia or PCOS: no Is patient over 77, have PCOS,  family history of premature CHD under age 70, diabetes, smoke, have hypertension or peripheral artery disease:  no At any time, has a partner hit, kicked or otherwise hurt or frightened you?: no Over the past 2 weeks, have you felt down, depressed or hopeless?: no Over the past 2 weeks, have you felt little interest or pleasure in doing things?:not asked   Gynecologic History Patient's last menstrual period was 11/04/2017 (exact date). Contraception: Depo-Provera injections Last Pap: 07/30/15. Results were: ASCUS +HPV (did not have colposcopy) Last mammogram: n/a <21 years, no significant family hx.  Obstetric History OB History  Gravida Para Term Preterm AB Living  3 3 3  0 0 3  SAB TAB Ectopic Multiple Live Births  0 0 0 0 3    # Outcome Date GA Lbr Len/2nd Weight Sex Delivery Anes PTL Lv  3 Term 06/13/17 [redacted]w[redacted]d 06:18 / 00:19 8 lb 10.8 oz (3.935 kg) F Vag-Spont None  LIV  2 Term 03/30/13    M Vag-Spont   LIV  1 Term 12/15/10    F Vag-Spont   LIV    Past Medical History:  Diagnosis Date  . Medical history non-contributory     Past Surgical History:  Procedure Laterality  Date  . NO PAST SURGERIES       Current Outpatient Medications:  .  acetaminophen (TYLENOL) 325 MG tablet, Take 650 mg every 6 (six) hours as needed by mouth for headache., Disp: , Rfl:  .  ibuprofen (ADVIL,MOTRIN) 600 MG tablet, Take 1 tablet (600 mg total) by mouth every 6 (six) hours., Disp: 60 tablet, Rfl: 0 .  ibuprofen (ADVIL,MOTRIN) 800 MG tablet, Take 1 tablet (800 mg total) by mouth every 8 (eight) hours as needed., Disp: 30 tablet, Rfl: 5 .  medroxyPROGESTERone (DEPO-PROVERA) 150 MG/ML injection, Inject 1 mL (150 mg total) into the muscle every 3 (three) months., Disp: 1 mL, Rfl: 4  Current Facility-Administered Medications:  .  medroxyPROGESTERone (DEPO-PROVERA) injection 150 mg, 150 mg, Intramuscular, Q90 days, Ardenia Stiner A, CNM No Known Allergies  Social History   Tobacco Use  . Smoking status: Never Smoker  . Smokeless tobacco: Never Used  Substance Use Topics  . Alcohol use: No    Alcohol/week: 0.0 oz    Family History  Problem Relation Age of Onset  . Diabetes Mother       Review of Systems  Constitutional: negative for fatigue and weight loss Respiratory: negative for cough and wheezing Cardiovascular: negative for chest pain, fatigue and palpitations Gastrointestinal: negative for abdominal pain and change in bowel habits Musculoskeletal:negative for myalgias Neurological: negative for gait problems and tremors Behavioral/Psych: negative for abusive relationship, depression Endocrine: negative for temperature intolerance  Genitourinary:negative for abnormal menstrual periods, genital lesions, hot flashes, sexual problems and vaginal discharge Integument/breast: negative for breast lump, breast tenderness, nipple discharge and skin lesion(s)    Objective:       BP 124/77   Pulse 74   Ht 5\' 6"  (1.676 m)   Wt 193 lb 4.8 oz (87.7 kg)   LMP 11/04/2017 (Exact Date)   BMI 31.20 kg/m  General:   alert  Skin:   no rash or abnormalities  Lungs:    clear to auscultation bilaterally  Heart:   regular rate and rhythm, S1, S2 normal, no murmur, click, rub or gallop  Breasts:   normal without suspicious masses, skin or nipple changes or axillary nodes  Abdomen:  normal findings: no organomegaly, soft, non-tender and no hernia  Pelvis:  External genitalia: normal general appearance Urinary system: urethral meatus normal and bladder without fullness, nontender Vaginal: normal without tenderness, induration or masses Cervix: normal appearance, ectropic cervix, friable, lesion noted at 6 o'clock Adnexa: normal bimanual exam Uterus: anteverted and non-tender, normal size   Lab Review Urine pregnancy test Labs reviewed yes Radiologic studies reviewed no  50% of 30 min visit spent on counseling and coordination of care.   Assessment & Plan    Healthy female exam.    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 Cervical smear, as part of routine gynecological examination     Leison noted at 6 o'clock: colposcopy recomended - Cytology - PAP  3. Encounter for initial prescription of contraceptive pills     Irregular bleeding with Depo-provera  - norethindrone (MICRONOR,CAMILA,ERRIN) 0.35 MG tablet; Take 1 tablet (0.35 mg total) by mouth daily.  Dispense: 1 Package; Refill: 11   Education reviewed: calcium supplements, depression evaluation, low fat, low cholesterol diet, safe sex/STD prevention, self breast exams, skin cancer screening and weight bearing exercise. Contraception: Depo-Provera injections and oral progesterone-only contraceptive. Follow up in: 1 year.   Meds ordered this encounter  Medications  . medroxyPROGESTERone (DEPO-PROVERA) injection 150 mg   No orders of the defined types were placed in this encounter.   Possible management options include: Kyleena IUD Follow up as needed.

## 2017-11-12 ENCOUNTER — Encounter: Payer: Self-pay | Admitting: Certified Nurse Midwife

## 2017-11-15 LAB — CYTOLOGY - PAP
Diagnosis: NEGATIVE
HPV (WINDOPATH): DETECTED — AB
HPV 16/18/45 GENOTYPING: NEGATIVE

## 2017-11-20 ENCOUNTER — Other Ambulatory Visit: Payer: Self-pay | Admitting: Certified Nurse Midwife

## 2017-11-20 DIAGNOSIS — R87820 Cervical low risk human papillomavirus (HPV) DNA test positive: Secondary | ICD-10-CM | POA: Insufficient documentation

## 2017-11-22 ENCOUNTER — Encounter: Payer: Medicaid Other | Admitting: Obstetrics & Gynecology

## 2017-11-22 ENCOUNTER — Telehealth: Payer: Self-pay

## 2017-11-22 NOTE — Telephone Encounter (Signed)
Dr. Debroah LoopArnold reviewed pt results and records and states she does not need a colpo today. I called pt and left her a voicemail telling her this and that she does not need to be seen for an appointment today and to call back with any questions regarding this.

## 2018-01-10 ENCOUNTER — Ambulatory Visit: Payer: Medicaid Other

## 2018-01-15 ENCOUNTER — Ambulatory Visit: Payer: Medicaid Other | Admitting: Obstetrics

## 2018-02-05 ENCOUNTER — Ambulatory Visit: Payer: Medicaid Other | Admitting: Obstetrics

## 2018-07-08 ENCOUNTER — Other Ambulatory Visit: Payer: Self-pay

## 2018-07-08 ENCOUNTER — Emergency Department (HOSPITAL_COMMUNITY)
Admission: EM | Admit: 2018-07-08 | Discharge: 2018-07-09 | Disposition: A | Discharge status: Home / Self Care | Payer: Medicaid Other | Attending: Emergency Medicine | Admitting: Emergency Medicine

## 2018-07-08 ENCOUNTER — Encounter (HOSPITAL_COMMUNITY): Payer: Self-pay | Admitting: Emergency Medicine

## 2018-07-08 DIAGNOSIS — Z79899 Other long term (current) drug therapy: Secondary | ICD-10-CM | POA: Insufficient documentation

## 2018-07-08 DIAGNOSIS — F322 Major depressive disorder, single episode, severe without psychotic features: Secondary | ICD-10-CM | POA: Insufficient documentation

## 2018-07-08 DIAGNOSIS — R45851 Suicidal ideations: Secondary | ICD-10-CM | POA: Diagnosis not present

## 2018-07-08 DIAGNOSIS — F329 Major depressive disorder, single episode, unspecified: Secondary | ICD-10-CM

## 2018-07-08 DIAGNOSIS — E860 Dehydration: Secondary | ICD-10-CM | POA: Insufficient documentation

## 2018-07-08 DIAGNOSIS — M791 Myalgia, unspecified site: Secondary | ICD-10-CM | POA: Diagnosis present

## 2018-07-08 DIAGNOSIS — F32A Depression, unspecified: Secondary | ICD-10-CM

## 2018-07-08 DIAGNOSIS — R51 Headache: Secondary | ICD-10-CM | POA: Diagnosis not present

## 2018-07-08 DIAGNOSIS — T887XXA Unspecified adverse effect of drug or medicament, initial encounter: Secondary | ICD-10-CM | POA: Diagnosis not present

## 2018-07-08 DIAGNOSIS — T50904A Poisoning by unspecified drugs, medicaments and biological substances, undetermined, initial encounter: Secondary | ICD-10-CM | POA: Diagnosis not present

## 2018-07-08 LAB — CBC
HCT: 36.8 % (ref 36.0–46.0)
HEMOGLOBIN: 11.4 g/dL — AB (ref 12.0–15.0)
MCH: 27 pg (ref 26.0–34.0)
MCHC: 31 g/dL (ref 30.0–36.0)
MCV: 87.2 fL (ref 80.0–100.0)
NRBC: 0 % (ref 0.0–0.2)
PLATELETS: 246 10*3/uL (ref 150–400)
RBC: 4.22 MIL/uL (ref 3.87–5.11)
RDW: 14.3 % (ref 11.5–15.5)
WBC: 5.4 10*3/uL (ref 4.0–10.5)

## 2018-07-08 LAB — I-STAT BETA HCG BLOOD, ED (MC, WL, AP ONLY)

## 2018-07-08 NOTE — ED Triage Notes (Addendum)
Per EMS, patient from home, c/o body aches initially and became SI after phone call en route. Reports plan to overdose.   Patient states "I don't want to hurt myself, I just don't want to live anymore."

## 2018-07-08 NOTE — ED Notes (Signed)
Bed: WTR5 Expected date:  Expected time:  Means of arrival:  Comments: 
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
## 2018-07-09 DIAGNOSIS — F322 Major depressive disorder, single episode, severe without psychotic features: Secondary | ICD-10-CM | POA: Diagnosis not present

## 2018-07-09 DIAGNOSIS — Z79899 Other long term (current) drug therapy: Secondary | ICD-10-CM | POA: Diagnosis not present

## 2018-07-09 DIAGNOSIS — F32A Depression, unspecified: Secondary | ICD-10-CM

## 2018-07-09 DIAGNOSIS — F329 Major depressive disorder, single episode, unspecified: Secondary | ICD-10-CM

## 2018-07-09 DIAGNOSIS — R45851 Suicidal ideations: Secondary | ICD-10-CM | POA: Diagnosis not present

## 2018-07-09 DIAGNOSIS — E860 Dehydration: Secondary | ICD-10-CM | POA: Diagnosis not present

## 2018-07-09 LAB — COMPREHENSIVE METABOLIC PANEL
ALBUMIN: 3.9 g/dL (ref 3.5–5.0)
ALT: 19 U/L (ref 0–44)
ANION GAP: 7 (ref 5–15)
AST: 22 U/L (ref 15–41)
Alkaline Phosphatase: 48 U/L (ref 38–126)
BILIRUBIN TOTAL: 0.5 mg/dL (ref 0.3–1.2)
BUN: 9 mg/dL (ref 6–20)
CO2: 24 mmol/L (ref 22–32)
Calcium: 8.9 mg/dL (ref 8.9–10.3)
Chloride: 107 mmol/L (ref 98–111)
Creatinine, Ser: 0.54 mg/dL (ref 0.44–1.00)
GFR calc non Af Amer: >60 mL/min (ref >60)
GLUCOSE: 96 mg/dL (ref 70–99)
POTASSIUM: 3.4 mmol/L — AB (ref 3.5–5.1)
SODIUM: 138 mmol/L (ref 135–145)
TOTAL PROTEIN: 7.1 g/dL (ref 6.5–8.1)

## 2018-07-09 LAB — ACETAMINOPHEN LEVEL

## 2018-07-09 LAB — SALICYLATE LEVEL: Salicylate Lvl: <7 mg/dL (ref 2.8–30.0)

## 2018-07-09 LAB — ETHANOL: Alcohol, Ethyl (B): <10 mg/dL (ref <10)

## 2018-07-09 MED ORDER — IBUPROFEN 200 MG PO TABS: 400.0000 mg | ORAL_TABLET | Freq: Four times a day (QID) | ORAL | Status: DC | PRN

## 2018-07-09 MED ORDER — ALUM & MAG HYDROXIDE-SIMETH 200-200-20 MG/5ML PO SUSP: 30.0000 mL | Freq: Four times a day (QID) | ORAL | Status: DC | PRN

## 2018-07-09 MED ORDER — ADULT MULTIVITAMIN W/MINERALS CH
1.0000 | ORAL_TABLET | Freq: Every day | ORAL | Status: DC
  Administered 2018-07-09: 1 via ORAL
  Filled 2018-07-09: qty 1

## 2018-07-09 MED ORDER — POTASSIUM CHLORIDE CRYS ER 20 MEQ PO TBCR
40.0000 meq | EXTENDED_RELEASE_TABLET | Freq: Once | ORAL | Status: AC
  Administered 2018-07-09: 40 meq via ORAL
  Filled 2018-07-09: qty 2

## 2018-07-09 MED ORDER — ACETAMINOPHEN 325 MG PO TABS: 650.0000 mg | ORAL_TABLET | ORAL | Status: DC | PRN

## 2018-07-09 NOTE — BHH Suicide Risk Assessment (Addendum)
[facility] Discharge Suicide Risk Assessment   Principal Problem: Depression Discharge Diagnoses: Principal Problem:   Depression   Total Time spent with patient: 30 minutes   Christine Hodge presents to the hospital with passive SI. Yesterday she reported, "I don't want to hurt myself, I just don't want to live anymore." Today she reports feeling better. She reports that yesterday she had a headache and also had a recent break up with her boyfriend. She realizes that this relationship was not good for her and she needed to leave for her children. She has a 1, 5 and 7 y/o child. Her aunt is currently watching them. She reports a good support system. She denies a prior psychiatric history. She denies problems with sleep or appetite. She denies SI, HI or AVH. She denies a history of suicide attempts. She denies access to guns or weapons. She denies alcohol or illicit substance use. She works for a [employer] and reports that work has been going well.   Musculoskeletal: Strength & Muscle Tone: within normal limits Gait & Station: UTA since patient is lying in bed. Patient leans: N/A  Psychiatric Specialty Exam: Review of Systems  Psychiatric/Behavioral: Positive for depression. Negative for hallucinations, substance abuse and suicidal ideas. The patient does not have insomnia.   All other systems reviewed and are negative.   Blood pressure 108/61, pulse 76, temperature 98.2 F (36.8 C), temperature source Oral, resp. rate 16, last menstrual period 06/30/2018, SpO2 100 %, currently breastfeeding.There is no height or weight on file to calculate BMI.  General Appearance: Fairly Groomed, young female, wearing paper hospital scrubs who is lying in bed. NAD.   Eye Contact::  Good  Speech:  Clear and Coherent and Normal Rate  Volume:  Normal  Mood:  Depressed  Affect:  Constricted  Thought Process:  Goal Directed, Linear and Descriptions of Associations: Intact  Orientation:  Full (Time, Place, and  Person)  Thought Content:  Logical  Suicidal Thoughts:  No  Homicidal Thoughts:  No  Memory:  Immediate;   Good Recent;   Good Remote;   Good  Judgement:  Fair  Insight:  Fair  Psychomotor Activity:  Normal  Concentration:  Good  Recall:  Good  Fund of Knowledge:Good  Language: Good  Akathisia:  No  Handed:  Right  AIMS (if indicated):   N/A  Assets:  Communication Skills Desire for Improvement Financial Resources/Insurance Housing Physical Health Resilience Social Support  Sleep:     Cognition: WNL  ADL's:  Intact   Mental Status Per Nursing Assessment::   On Admission:    Patient says that "I'm tired of living."  She says that she has felt this way for the last two days but won't elaborate any other than to say "It has something to do with communication."  Patient denies any HI or A/V hallucinations.  No ETOH or illicit drug use.  Demographic Factors:  NA  Loss Factors: Loss of significant relationship  Historical Factors: NA  Risk Reduction Factors:   Responsible for children under 41 years of age, Sense of responsibility to family, Employed, Living with another person, especially a relative and Positive social support  Continued Clinical Symptoms:  Depression   Cognitive Features That Contribute To Risk:  None    Suicide Risk:  Minimal: No identifiable suicidal ideation.  Patients presenting with no risk factors but with morbid ruminations; may be classified as minimal risk based on the severity of the depressive symptoms  Assessment:  Christine Hodge is  a 28 y.o. female who was admitted with passive SI. Today, she denies SI, HI or AVH. She is future oriented and help seeking. She will be provided with outpatient mental health resources. She does not warrant inpatient psychiatric hospitalization at this time.   Plan Of Care/Follow-up recommendations:  -Patient will be provided with outpatient mental health resources.  -Discharge home.   Cherly BeachJacqueline J  Randeep Biondolillo, DO 07/09/2018, 11:24 AM

## 2018-07-09 NOTE — Discharge Instructions (Signed)
For your behavioral health needs you are advised to follow up with [facility].  New patients are seen at their walk-in clinic.  Walk-in hours are Monday - Friday from 8:00 am - 12:00 pm, and from 1:00 pm - 3:00 pm.  Walk-in patients are seen on a first come, first served basis, so try to arrive as early as possible for the best chance of being seen the same day.  There is an initial fee of $22.50: ° °     [facility] °     [address] °     [place], [place] [address] °     [phone] °

## 2018-07-09 NOTE — ED Notes (Signed)
Pt discharged home. Discharged instructions read to pt who verbalized understanding. All belongings returned to pt who signed for same. Denies SI/HI, is not delusional and not responding to internal stimuli. Escorted pt to the ED exit.    

## 2018-07-09 NOTE — ED Provider Notes (Signed)
[facility]-EMERGENCY DEPT Provider Note   CSN: [id number] Arrival date & time: 07/08/18  2248     History   Chief Complaint Chief Complaint  Patient presents with  . Suicidal    HPI Christine Hodge is a 28 y.o. female.  HPI   28 yo F with no significant PMHx here with depression and generalized body aches.  The patient states that she has been under significant recent stressors.  She has begun to feel worsening depression and felt like she does not want to be here.  She states she does not want to be alive.  Denies overt plan for suicidal ideation.  Denies known history of suicidal attempt.  She states that earlier today, she began to feel generalized fatigue as well as lightheadedness upon standing.  She felt generalized body aches.  No fevers or chills.  She does admit to poor recent p.o. intake due to her depression and loss of appetite.  She is also not sleeping well.  Denies any pain currently.  No chest pain or shortness of breath.  No other medical complaints.  Past Medical History:  Diagnosis Date  . Medical history non-contributory     Patient Active Problem List   Diagnosis Date Noted  . 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 Maternal excessive weight gain, third trimester 04/04/2017  . Hemorrhoids during pregnancy in second trimester 02/12/2017  . Hypopigmentation 07/30/2015    Past Surgical History:  Procedure Laterality Date  . NO PAST SURGERIES       OB History    Gravida  3   Para  3   Term  3   Preterm  0   AB  0   Living  3     SAB  0   TAB  0   Ectopic  0   Multiple  0   Live Births  3            Home Medications    Prior to Admission medications   Medication Sig Start Date End Date Taking? Authorizing Provider  ibuprofen (ADVIL,MOTRIN) 200 MG tablet Take 400 mg by mouth every 6 (six) hours as needed for moderate pain.   Yes [provider]  Multiple  Vitamin (MULTIVITAMIN WITH MINERALS) TABS tablet Take 1 tablet by mouth daily.   Yes [provider]  ibuprofen (ADVIL,MOTRIN) 600 MG tablet Take 1 tablet (600 mg total) by mouth every 6 (six) hours. Patient not taking: Reported on 07/08/2018 06/15/17   Alroy Bailiff, MD  ibuprofen (ADVIL,MOTRIN) 800 MG tablet Take 1 tablet (800 mg total) by mouth every 8 (eight) hours as needed. Patient not taking: Reported on 07/08/2018 08/10/17   Brock Bad, MD  medroxyPROGESTERone (DEPO-PROVERA) 150 MG/ML injection Inject 1 mL (150 mg total) into the muscle every 3 (three) months. Patient not taking: Reported on 07/08/2018 08/10/17   Brock Bad, MD  norethindrone (MICRONOR,CAMILA,ERRIN) 0.35 MG tablet Take 1 tablet (0.35 mg total) by mouth daily. Patient not taking: Reported on 07/08/2018 11/09/17   Roe Coombs, CNM    Family History Family History  Problem Relation Age of Onset  . Diabetes Mother     Social History Social History   Tobacco Use  . Smoking status: Never Smoker  . Smokeless tobacco: Never Used  Substance Use Topics  . Alcohol use: No    Alcohol/week: 0.0 standard drinks  . Drug use: No     Allergies  Patient has no known allergies.   Review of Systems Review of Systems  Constitutional: Positive for fatigue. Negative for chills and fever.  HENT: Negative for congestion and rhinorrhea.   Eyes: Negative for visual disturbance.  Respiratory: Negative for cough, shortness of breath and wheezing.   Cardiovascular: Negative for chest pain and leg swelling.  Gastrointestinal: Negative for abdominal pain, diarrhea, nausea and vomiting.  Genitourinary: Negative for dysuria and flank pain.  Musculoskeletal: Positive for arthralgias. Negative for neck pain and neck stiffness.  Skin: Negative for rash and wound.  Allergic/Immunologic: Negative for immunocompromised state.  Neurological: Negative for syncope, weakness and headaches.  Psychiatric/Behavioral:  Positive for suicidal ideas.  All other systems reviewed and are negative.    Physical Exam Updated Vital Signs LMP 06/30/2018   Physical Exam Vitals signs and nursing note reviewed.  Constitutional:      General: She is not in acute distress.    Appearance: She is well-developed.  HENT:     Head: Normocephalic and atraumatic.     Comments: Dry mucous membranes Eyes:     Conjunctiva/sclera: Conjunctivae normal.  Neck:     Musculoskeletal: Neck supple.  Cardiovascular:     Rate and Rhythm: Normal rate and regular rhythm.     Heart sounds: Normal heart sounds. No murmur. No friction rub.  Pulmonary:     Effort: Pulmonary effort is normal. No respiratory distress.     Breath sounds: Normal breath sounds. No wheezing or rales.  Abdominal:     General: There is no distension.     Palpations: Abdomen is soft.     Tenderness: There is no abdominal tenderness.  Skin:    General: Skin is warm.     Capillary Refill: Capillary refill takes less than 2 seconds.  Neurological:     Mental Status: She is alert and oriented to person, place, and time.     Motor: No abnormal muscle tone.  Psychiatric:        Behavior: Behavior is withdrawn.        Thought Content: Thought content includes suicidal ideation.      ED Treatments / Results  Labs (all labs ordered are listed, but only abnormal results are displayed) Labs Reviewed  COMPREHENSIVE METABOLIC PANEL - Abnormal; Notable for the following components:      Result Value   Potassium 3.4 (*)    All other components within normal limits  ACETAMINOPHEN LEVEL - Abnormal; Notable for the following components:   Acetaminophen (Tylenol), Serum <10 (*)    All other components within normal limits  CBC - Abnormal; Notable for the following components:   Hemoglobin 11.4 (*)    All other components within normal limits  ETHANOL  SALICYLATE LEVEL  RAPID URINE DRUG SCREEN, HOSP PERFORMED  I-STAT BETA HCG BLOOD, ED ([facility], [facility], [facility] ONLY)     EKG None  Radiology No results found.  Procedures Procedures (including critical care time)  Medications Ordered in ED Medications - No data to display   Initial Impression / Assessment and Plan / ED Course  I have reviewed the triage vital signs and the nursing notes.  Pertinent labs & imaging results that were available during my care of the patient were reviewed by me and considered in my medical decision making (see chart for details).     28 year old female here with worsening depression and SI.  TTS consulted.  Lab work is overall reassuring.  Regarding her lightheadedness and body aches, suspect she could be  mildly dehydrated with component of orthostasis.  She does not appear toxic clinic currently.  She is afebrile.  Her symptoms are largely resolved.  No focal neuro deficits.  Will encourage fluids and monitor.  Medically stable for psych disposition.  Final Clinical Impressions(s) / ED Diagnoses   Final diagnoses:  Suicidal ideation  Dehydration    ED Discharge Orders    None       Shaune Pollack, MD 07/09/18 [phone]

## 2018-07-09 NOTE — BH Assessment (Signed)
BHH Assessment Progress Note   Clinician and AC Fransico Michael discussed the fact the patient did not want to come in for inpatient care.  Clinician did go and talk with patient and the advantages of inpatient care and that she was being considered for a bed at [facility].    Patient still does not want to sign herself in for inpatient psychiatric care. Clinician outlined the alternative that patient could possibly be IVC'ed.  Patient says she feels better and does not wish to harm herself now, she only wants to go be with her three children.  Clinician discussed patient care with Nira Conn, FNP.  He said that patient should be seen by psychiatry to see if they will IVC patient.  Barbara Cower had recommended inpatient care.  Clinician informed Hassie Bruce.

## 2018-07-09 NOTE — BH Assessment (Signed)
BHH Assessment Progress Note  Per Jacqueline Norman, DO, this pt does not require psychiatric hospitalization at this time.  Pt is to be discharged from [facility] with recommendation to follow up with [facility].  This has been included in pt's discharge instructions.  Pt's nurse, Diane, has been notified.  Jazelyn Sipe, MA Triage Specialist [phone]     

## 2018-07-09 NOTE — ED Notes (Signed)
Admitted to room 40 from triage due to complaint of feelings of SI. Reported to writer passive SI, no plan or intent to kill self just sad and believes there is no reason for her to be here if she is unhappy. Passive SI. States she has been feeling this way for a few days and has not felt this way before. Possible trigger to her sadness but she would not share what it was. She states she has a boyfriend and this is part of the issue but would not elaborate. Sad affect, no eye contact, states she doesn't want to be here. Writer clarified for her what to expect, and to expect to be here tonight.

## 2018-11-16 IMAGING — US US MFM OB COMP +14 WKS
1 series · 14 of 28 positions shown · non-contrast
Comparison: none

[Series 1: us mfm ob comp +14 wks · 59 acquisitions, 14 frames shown]
[im 3/59]
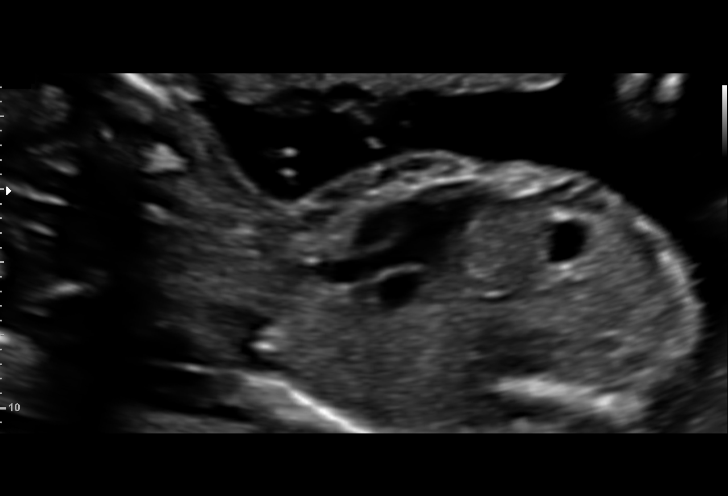
[im 7/59]
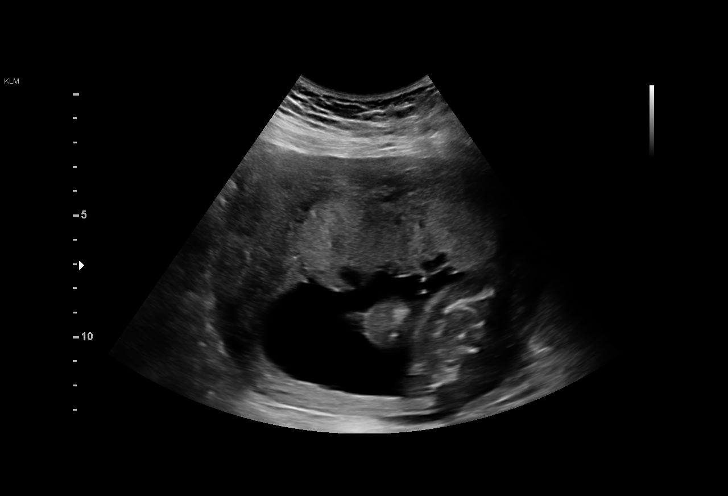
[im 11/59]
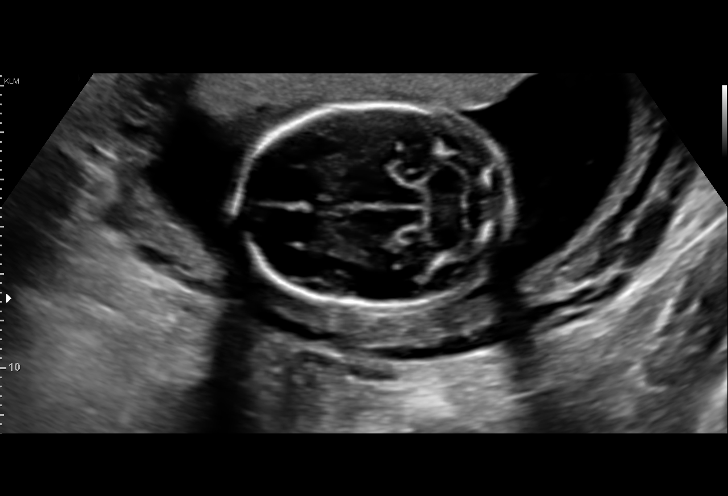
[im 16/59]
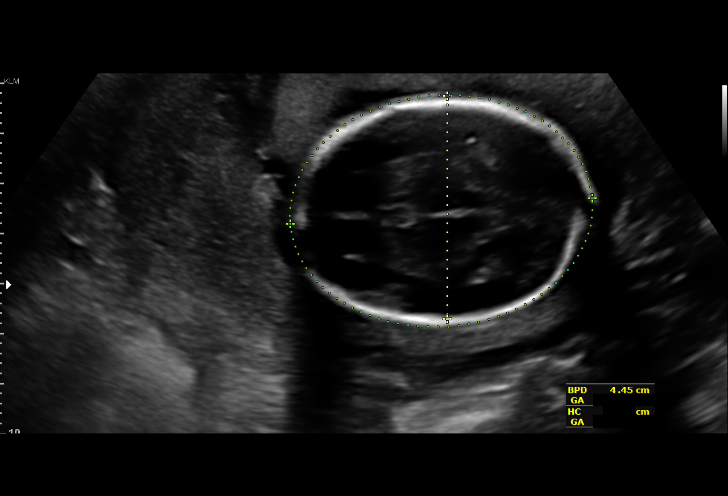
[im 20/59]
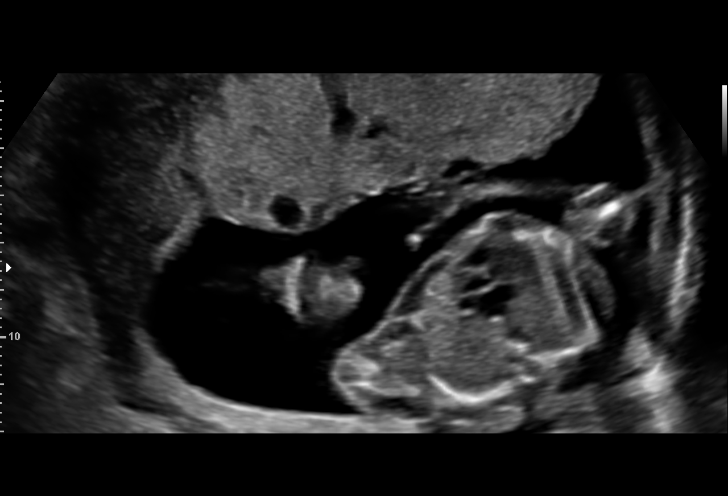
[im 24/59]
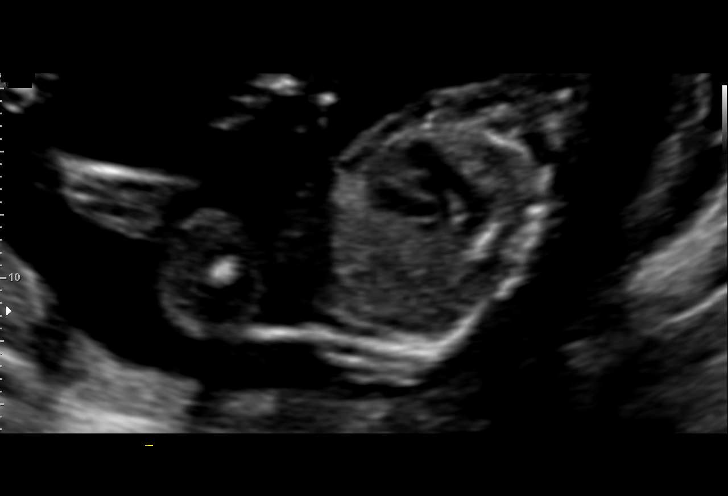
[im 28/59]
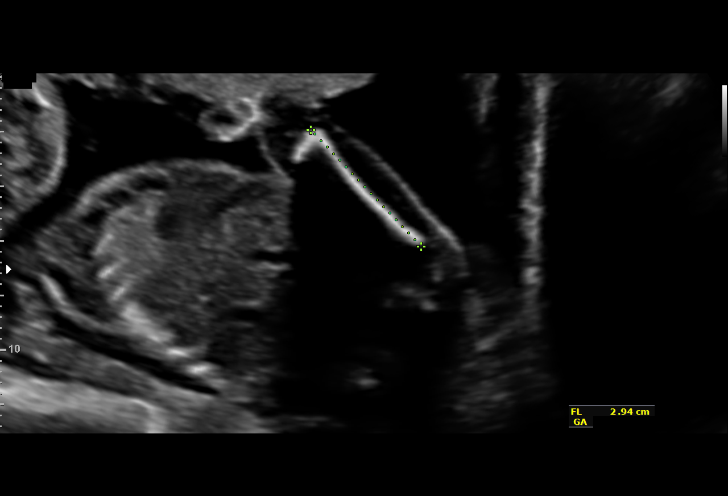
[im 33/59]
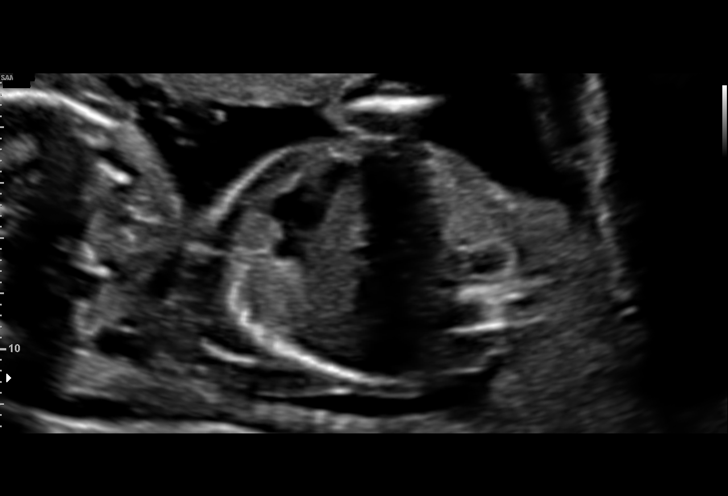
[im 37/59]
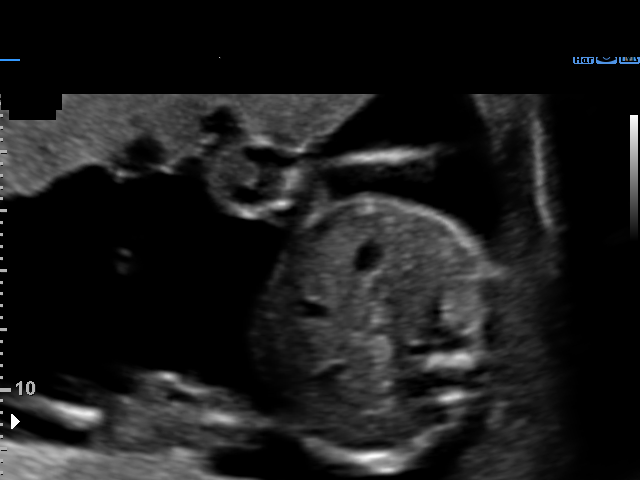
[im 41/59]
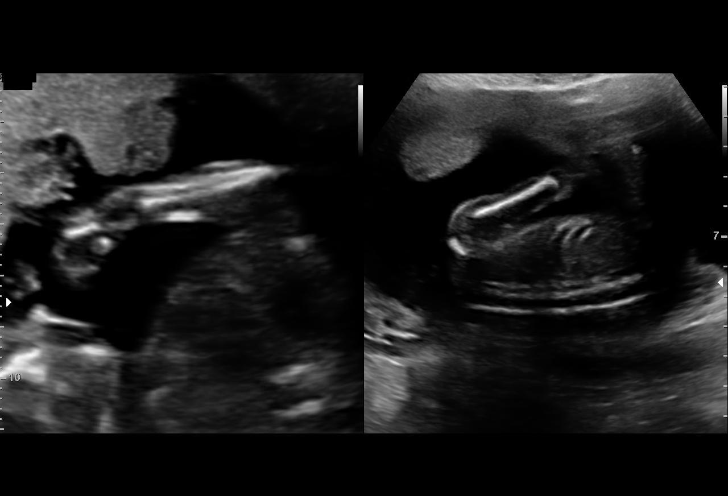
[im 46/59]
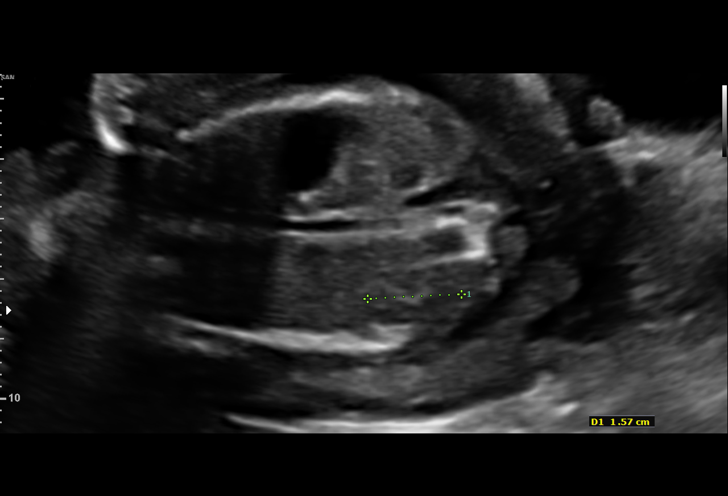
[im 50/59]
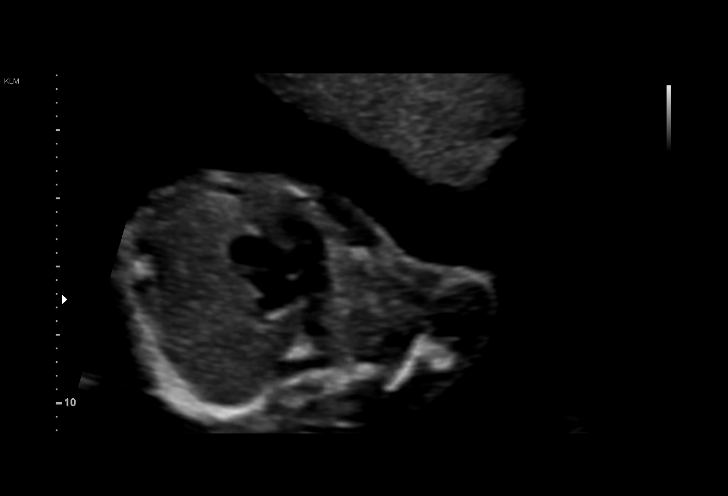
[im 54/59]
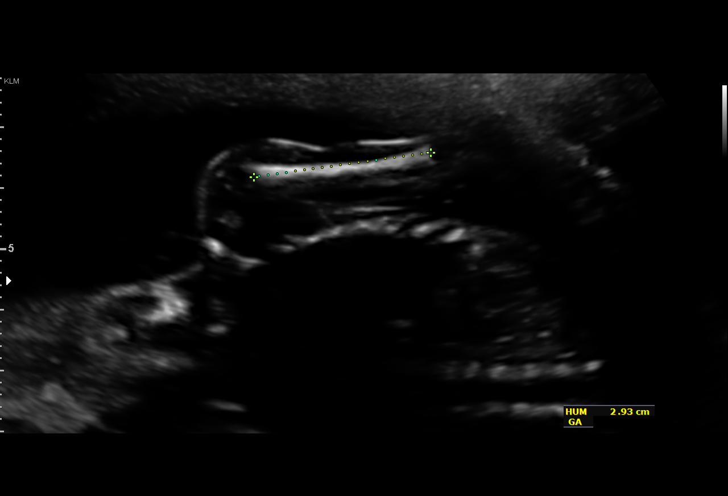
[im 59/59]
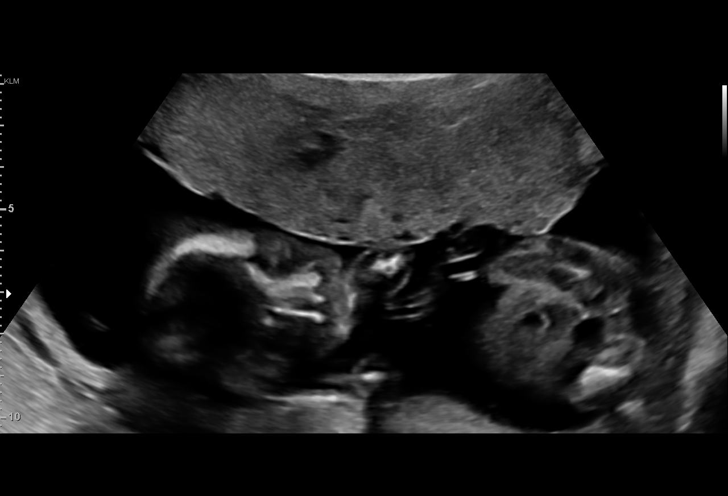

[14 of 28 positions shown; findings below may reference images not displayed]

Road [HOSPITAL]

Indications

19 weeks gestation of pregnancy
Encounter for antenatal screening for
malformations
OB History

Gravidity:    3         Term:   2
Living:       2
Fetal Evaluation

Num Of Fetuses:     1
Fetal Heart         158
Rate(bpm):
Cardiac Activity:   Observed
Presentation:       Breech
Placenta:           Anterior, above cervical os
P. Cord Insertion:  Visualized, central

Amniotic Fluid
AFI FV:      Subjectively within normal limits
Biometry

BPD:      43.8  mm     G. Age:  19w 2d         49  %    CI:        70.58   %   70 - 86
FL/HC:      17.6   %   16.1 -
HC:      166.2  mm     G. Age:  19w 2d         44  %    HC/AC:      1.17       1.09 -
AC:      142.5  mm     G. Age:  19w 4d         56  %    FL/BPD:     66.9   %
FL:       29.3  mm     G. Age:  19w 0d         34  %    FL/AC:      20.6   %   20 - 24
HUM:        29  mm     G. Age:  19w 3d         55  %
CER:      19.5  mm     G. Age:  18w 5d         37  %

Est. FW:     286  gm    0 lb 10 oz      47  %
Gestational Age

LMP:           23w 3d       Date:   07/30/16                 EDD:   05/06/17
U/S Today:     19w 2d                                        EDD:   06/04/17
Best:          19w 2d    Det. By:   U/S (01/10/17)           EDD:   06/04/17
Anatomy

Cranium:               Appears normal         Aortic Arch:            Not well visualized
Cavum:                 Appears normal         Ductal Arch:            Appears normal
Ventricles:            Appears normal         Diaphragm:              Appears normal
Choroid Plexus:        Appears normal         Stomach:                Appears normal, left
sided
Cerebellum:            Appears normal         Abdomen:                Appears normal
Posterior Fossa:       Appears normal         Abdominal Wall:         Appears nml (cord
insert, abd wall)
Nuchal Fold:           Appears normal         Cord Vessels:           Appears normal (3
vessel cord)
Face:                  Orbits nl; profile not Kidneys:                Appear normal
well visualized
Lips:                  Appears normal         Bladder:                Appears normal
Thoracic:              Appears normal         Spine:                  Not well visualized
Heart:                 Appears normal         Upper Extremities:      Appears normal
(4CH, axis, and situs
RVOT:                  Appears normal         Lower Extremities:      Appears normal
LVOT:                  Appears normal

Other:  Fetus appears to be a female. Heels visualized.
Cervix Uterus Adnexa

Cervix
Length:            3.9  cm.
Normal appearance by transabdominal scan.
Impression

SIUP at 19+2 weeks
Normal detailed fetal anatomy; limited views of profile, AA
and spine
Markers of aneuploidy: none
Normal amniotic fluid volume
EDC based on today's measurements; 06/04/17
Recommendations

Follow-up ultrasound in 4-6 weeks to complete anatomy
survey

## 2018-12-17 ENCOUNTER — Ambulatory Visit: Payer: Medicaid Other | Admitting: Certified Nurse Midwife

## 2018-12-17 ENCOUNTER — Other Ambulatory Visit: Payer: Self-pay

## 2018-12-17 ENCOUNTER — Encounter: Payer: Self-pay | Admitting: Certified Nurse Midwife

## 2018-12-17 VITALS — BP 112/73 | HR 84 | Wt 199.6 lb

## 2018-12-17 DIAGNOSIS — Z3009 Encounter for other general counseling and advice on contraception: Secondary | ICD-10-CM | POA: Diagnosis not present

## 2018-12-17 DIAGNOSIS — N912 Amenorrhea, unspecified: Secondary | ICD-10-CM

## 2018-12-17 DIAGNOSIS — Z3201 Encounter for pregnancy test, result positive: Secondary | ICD-10-CM | POA: Diagnosis not present

## 2018-12-17 LAB — POCT URINE PREGNANCY: Preg Test, Ur: POSITIVE — AB

## 2018-12-17 NOTE — Progress Notes (Signed)
   GYNECOLOGY OFFICE VISIT NOTE  History:  28 y.o. [id number] here today for Nexplanon insertion. She denies any abnormal vaginal discharge, bleeding, pelvic pain or other concerns. Last IC was 2 days, states she used a condom. LMP was 11/05/18. Reports menses are irregular sometimes.   Past Medical History:  Diagnosis Date  . Medical history non-contributory     Past Surgical History:  Procedure Laterality Date  . NO PAST SURGERIES      The following portions of the patient's history were reviewed and updated as appropriate: allergies, current medications, past family history, past medical history, past social history, past surgical history and problem list.   Review of Systems:  Genito-Urinary ROS: negative except noted in HPI   Objective:  Physical Exam BP 112/73   Pulse 84   Wt 90.5 kg   LMP 11/05/2018 (Approximate)   BMI 32.22 kg/m  CONSTITUTIONAL: Well-developed, well-nourished female in no acute distress.  HENT:  Normocephalic, atraumatic EYES: Conjunctivae and EOM are normal NECK: Normal range of motion SKIN: Skin is warm and dry NEUROLOGIC: Alert and oriented to person, place, and time PSYCHIATRIC: Normal mood and affect CARDIOVASCULAR: Normal heart rate noted RESPIRATORY: Effort and rate normal MUSCULOSKELETAL: Normal range of motion  Labs and Imaging UPT- positive  Assessment & Plan:  Amenorrhea Positive pregnancy test  Informed pt of +UPT Pt states wasn't prepared for that news and wants to know if abortion is legal- informed pt it is Pt likely planning termination and will return later for Nexplanon insertion If desires to continue pregnancy will need to start prenatal care Follow up as needed  Live interpreter present for encounter  Total face-to-face time with patient: 15 minutes   Julianne Handler, [place] 12/17/2018 11:38 AM

## 2018-12-17 NOTE — Progress Notes (Signed)
Pt is here for birth control consult. She is interested in the nexplanon. Pt last IC was 2 days ago, pt reports using a condom. UPT is positive.

## 2019-04-03 ENCOUNTER — Other Ambulatory Visit: Payer: Self-pay

## 2019-04-03 ENCOUNTER — Ambulatory Visit (INDEPENDENT_AMBULATORY_CARE_PROVIDER_SITE_OTHER): Payer: PRIVATE HEALTH INSURANCE | Admitting: Obstetrics

## 2019-04-03 DIAGNOSIS — Z3687 Encounter for antenatal screening for uncertain dates: Secondary | ICD-10-CM

## 2019-04-03 DIAGNOSIS — Z3201 Encounter for pregnancy test, result positive: Secondary | ICD-10-CM

## 2019-04-03 LAB — POCT URINE PREGNANCY: Preg Test, Ur: POSITIVE — AB

## 2019-04-03 NOTE — Progress Notes (Signed)
Nurse visit with positive pregnancy test but unsure LMP.  Shelly Bombard, MD 04/03/2019 1:50 PM

## 2019-04-03 NOTE — Progress Notes (Addendum)
Pt is here for a UPT.  UPT is positive.  Pt is not sure of LMP.  She stated that in July she got an abortion and haven't had a period since.  Spoke with Dr. Jodi Mourning. He suggest that an ultrasound be done before scheduling pt for new ob appointment. Ultrasound order placed. -EH/RMA  Patient seen and assessed by nursing staff during this encounter. I have reviewed the chart and agree with the documentation and plan.  Baltazar Najjar, MD  04/03/2019     04/03/2019 1:54 PM

## 2019-04-11 ENCOUNTER — Other Ambulatory Visit: Payer: Self-pay

## 2019-04-11 ENCOUNTER — Encounter (HOSPITAL_COMMUNITY): Payer: Self-pay

## 2019-04-11 ENCOUNTER — Ambulatory Visit (HOSPITAL_COMMUNITY)
Admission: RE | Admit: 2019-04-11 | Discharge: 2019-04-11 | Disposition: A | Discharge status: Home / Self Care | Payer: 59 | Source: Ambulatory Visit | Attending: Obstetrics and Gynecology | Admitting: Obstetrics and Gynecology

## 2019-04-11 ENCOUNTER — Other Ambulatory Visit: Payer: Self-pay | Admitting: Obstetrics

## 2019-04-11 DIAGNOSIS — Z363 Encounter for antenatal screening for malformations: Secondary | ICD-10-CM

## 2019-04-11 DIAGNOSIS — Z3687 Encounter for antenatal screening for uncertain dates: Secondary | ICD-10-CM | POA: Diagnosis not present

## 2019-04-11 DIAGNOSIS — O26842 Uterine size-date discrepancy, second trimester: Secondary | ICD-10-CM | POA: Diagnosis not present

## 2019-04-11 DIAGNOSIS — Z3A14 14 weeks gestation of pregnancy: Secondary | ICD-10-CM

## 2019-04-14 ENCOUNTER — Other Ambulatory Visit (HOSPITAL_COMMUNITY): Payer: Self-pay | Admitting: *Deleted

## 2019-04-14 ENCOUNTER — Other Ambulatory Visit: Payer: Self-pay | Admitting: Obstetrics

## 2019-04-14 DIAGNOSIS — Z362 Encounter for other antenatal screening follow-up: Secondary | ICD-10-CM

## 2019-05-16 ENCOUNTER — Other Ambulatory Visit (HOSPITAL_COMMUNITY): Payer: Self-pay | Admitting: *Deleted

## 2019-05-16 ENCOUNTER — Ambulatory Visit (HOSPITAL_COMMUNITY)
Admission: RE | Admit: 2019-05-16 | Discharge: 2019-05-16 | Disposition: A | Discharge status: Home / Self Care | Payer: 59 | Source: Ambulatory Visit | Attending: Obstetrics | Admitting: Obstetrics

## 2019-05-16 ENCOUNTER — Other Ambulatory Visit: Payer: Self-pay

## 2019-05-16 DIAGNOSIS — O359XX0 Maternal care for (suspected) fetal abnormality and damage, unspecified, not applicable or unspecified: Secondary | ICD-10-CM

## 2019-05-16 DIAGNOSIS — Z362 Encounter for other antenatal screening follow-up: Secondary | ICD-10-CM | POA: Diagnosis not present

## 2019-05-16 DIAGNOSIS — Z3A19 19 weeks gestation of pregnancy: Secondary | ICD-10-CM

## 2019-06-02 ENCOUNTER — Other Ambulatory Visit: Payer: Self-pay

## 2019-06-02 ENCOUNTER — Encounter: Payer: Self-pay | Admitting: Advanced Practice Midwife

## 2019-06-02 ENCOUNTER — Ambulatory Visit (INDEPENDENT_AMBULATORY_CARE_PROVIDER_SITE_OTHER): Payer: Medicaid Other | Admitting: Advanced Practice Midwife

## 2019-06-02 ENCOUNTER — Other Ambulatory Visit (HOSPITAL_COMMUNITY)
Admission: RE | Admit: 2019-06-02 | Discharge: 2019-06-02 | Disposition: A | Discharge status: Home / Self Care | Payer: 59 | Source: Ambulatory Visit | Attending: Advanced Practice Midwife | Admitting: Advanced Practice Midwife

## 2019-06-02 VITALS — BP 105/67 | HR 87 | Temp 98.1°F | Wt 191.5 lb

## 2019-06-02 DIAGNOSIS — B951 Streptococcus, group B, as the cause of diseases classified elsewhere: Secondary | ICD-10-CM

## 2019-06-02 DIAGNOSIS — Z3481 Encounter for supervision of other normal pregnancy, first trimester: Secondary | ICD-10-CM | POA: Diagnosis not present

## 2019-06-02 DIAGNOSIS — Z3490 Encounter for supervision of normal pregnancy, unspecified, unspecified trimester: Secondary | ICD-10-CM | POA: Insufficient documentation

## 2019-06-02 DIAGNOSIS — A568 Sexually transmitted chlamydial infection of other sites: Secondary | ICD-10-CM

## 2019-06-02 DIAGNOSIS — Z3A21 21 weeks gestation of pregnancy: Secondary | ICD-10-CM

## 2019-06-02 DIAGNOSIS — N898 Other specified noninflammatory disorders of vagina: Secondary | ICD-10-CM

## 2019-06-02 DIAGNOSIS — O98312 Other infections with a predominantly sexual mode of transmission complicating pregnancy, second trimester: Secondary | ICD-10-CM

## 2019-06-02 DIAGNOSIS — O2342 Unspecified infection of urinary tract in pregnancy, second trimester: Secondary | ICD-10-CM

## 2019-06-02 LAB — OB RESULTS CONSOLE GBS: GBS: POSITIVE

## 2019-06-02 MED ORDER — TERCONAZOLE 0.4 % VA CREA: 1.0000 | TOPICAL_CREAM | Freq: Every day | VAGINAL | 0 refills | Status: DC

## 2019-06-02 MED ORDER — BLOOD PRESSURE KIT DEVI: 1.0000 | 0 refills | Status: DC

## 2019-06-02 NOTE — Patient Instructions (Signed)
Segundo trimestre da gestao Second Trimester of Pregnancy O segundo trimestre da gestao vai da semana 14 at a semana 27 (do 4 ao 6 ms). O segundo trimestre com frequncia  o perodo no qual voc se sente melhor. Seu corpo j se acostumou  Christine Hodge, e voc comea a se sentir melhor fisicamente. Geralmente, o enjoo matinal diminui ou cessa completamente e voc pode sentir um aumento da energia e do apetite. O segundo trimestre tambm  um perodo no qual o feto se desenvolve rapidamente. No final do sexto ms, o feto tem por volta de 9 polegadas de comprimento e pesa por volta de 1 libra. Voc provavelmente comear a sentir o beb se mexer (percepo dos primeiros movimentos) entre a semana 16 e a semana 21 da gestao. Alteraes corporais durante o segundo trimestre Seu corpo continua a passar por muitas mudanas durante o segundo trimestre. As mudanas variam de mulher para [employer].  Seu peso continuar a Garment/textile technologist. Voc notar que a parte inferior de seu abdome ficar saliente.  Voc pode comear a ter estrias nos quadris, abdome e seios.  Voc poder sentir dores de cabea que podem ser [place] por medicamentos. Os medicamentos devero ser aprovados pelo seu mdico.  Voc pode urinar com mais frequncia, pois o feto est pressionando sua bexiga.  Voc pode manifestar ou continuar a sentir Engineering geologist de Ozzie Hoyle.  Voc pode ficar com priso de ventre, pois certos hormnios esto fazendo com que os msculos que empurram as fezes em seu intestino fiquem mais lentos.  Voc poder apresentar hemorroidas ou veias inchadas e salientes (veias varicosas).  Voc poder sentir dor nas costas. Isso  causado por: ? [employer]. ? Hormnios da gravidez, que esto relaxando suas articulaes e sua pelve. ? Uma alterao no peso e nos msculos que suportam seu equilbrio.  Seus seios continuaro a crescer e ficaro ainda mais sensveis.  Suas gengivas podero sangrar e ficar  sensveis  escovao e ao uso do fio dental.  Marcas escuras (cloasmas, mscaras de gravidez) podero surgir no seu rosto. Ela tende a desaparecer depois do nascimento do beb.  Pode surgir uma linha escura do seu umbigo at a regio pubiana (linea nigra). Ela tende a desaparecer depois do nascimento do beb.  Voc poder notar alteraes nos seus cabelos. Essas alteraes incluem espessamento, crescimento rpido e alteraes na textura do seu cabelo. Algumas mulheres tambm sofrem queda de cabelos durante ou aps a gravidez, ou cabelos com aspecto seco e fino. Seus cabelos provavelmente voltaro ao normal depois do nascimento do beb. O que esperar das suas consultas pr-natais Durante uma consulta pr-natal de rotina:  Voc ser pesada para garantir que voc e o feto estejam crescendo normalmente.  Sua presso arterial ser aferida.  Seu abdome ser medido para acompanhar o crescimento de seu beb.  Os batimentos cardacos do feto sero auscultados.  Todos os [employer] da consulta anterior sero discutidos. Seu mdico poder lhe perguntar:  Como voc est se sentindo.  Se voc est sentindo o beb se mexer.  Se voc teve algum sintoma anormal, como vazamento de lquido, sangramento, dores de cabea intensas ou clicas abdominais.  Se voc est usando derivados do tabaco, incluindo cigarros, tabaco de Higher education careers adviser ou cigarros eletrnicos.  Se voc tem alguma dvida. Outros exames que podero ser realizados durante seu segundo trimestre incluem:  Exames de sangue que verificam: ? Baixos nveis de ferro (anemia). ? Elevado nvel de acar no sangue, que afeta gestantes (diabetes gestacional) entre a  semana 24 e a semana 28. ? Anticorpos anti-Rh. Esse exame  para verificar a presena de uma protena nas hemcias (fator Rh).  Exames de urina para verificar se h infeces, diabetes ou protena na urina.  Um ultrassom para confirmar o crescimento e desenvolvimento correto  do beb.  Uma amniocentese para verificar possveis problemas genticos.  Avaliaes fetais para espinha bfida e sndrome de [place].  Exame de HIV (vrus da imunodeficincia humana). Os exames pr-natais de rotina incluem testes de HIV, a menos que voc opte por no realizar esse teste. Siga essas instrues em casa: Medicamentos  Siga as instrues do seu mdico com relao ao uso de medicamentos. Medicamentos especficos podem ser seguros ou perigosos de serem tomados durante a Cytogeneticist.  Tome vitaminas pr-natais que contenham pelo menos 600 microgramas (mcg) de cido flico.  Se sentir priso de ventre, experimente tomar um emoliente fecal, caso seu mdico aprove isso. Alimentos e [id number]   Guy Sandifer dieta equilibrada que inclua frutas e verduras frescas, gros integrais e boas fontes de protena, como carne, ovos ou tofu e produtos lcteos com baixo teor de [place]. Seu mdico ajudar a determinar a quantidade de ganho de peso correta para voc.  Evite carnes cruas e queijo no cozido. Eles carregam germes que podem causar defeitos de nascena no beb.  Caso voc ingira pouco clcio com os alimentos, converse com seu mdico para saber se voc deve tomar um suplemento dirio de clcio.  Limite os alimentos ricos em gordura e acares processados, tais como frituras e doces.  Para prevenir a priso de ventre: ? Beba lquidos em quantidade suficiente para manter sua urina clara ou na cor amarela plida. ? Coma alimentos ricos em [place], como frutas e verduras frescas, gros integrais e feijo. Atividades  Faa exerccios apenas conforme orientado pelo seu mdico. A maioria das mulheres pode continuar seus exerccios usuais durante a Occupational hygienist. Tente fazer exerccios por 30 minutos pelo menos 5 dias por semana. Pare de se exercitar caso sinta contraes uterinas.  Evite levantar peso, use sapatos sem salto e mantenha uma boa postura corporal.  As relaes sexuais podem continuar a  menos que seu mdico a oriente de [employer]. [place] dor e do desconforto  Use um bom suti de sustentao para evitar desconforto oriundo da sensibilidade nos seios.  Tome banhos mornos de assento para Runner, broadcasting/film/video dor ou desconforto causado pelas hemorroidas. Use um creme para hemorroidas caso seu mdico aprove.  Repouse com as pernas elevadas se tiver cibras nas pernas ou dor lombar.  Se apresentar veias varicosas, use meias elsticas. Eleve os ps por 15 minutos 3-4 vezes por dia. Modere a quantidade de sal em sua dieta. Cuidados pr-natais  Anote suas perguntas. Leve-as s consultas pr-natais.  Comparea a todas as suas consultas pr-natais como orientado pelo seu mdico. Isso  importante. Segurana  Sempre use o cinto de Solicitor.  Faa uma lista de nmeros de telefone de Careers information officer, incluindo nmeros de parentes, amigos, do hospital, da [employer]. Instrues gerais  Pea uma indicao de aulas locais de educao pr-natal ao seu mdico. Comece as aulas antes do incio do 6 ms de [employer].  Pea ajuda caso precise de aconselhamento ou de informaes nutricionais durante a gestao. Seu mdico poder oferecer orientao ou encaminh-la a um especialista para que voc obtenha ajuda sobre necessidades diversas.  No use banheiras de agu quente, saunas secas ou midas.  No tome duchas e no use absorventes internos ou absorventes perfumados.  No cruze suas pernas por longos perodos de tempo.  Mantenha distncia de caixas de areia de gatos e de terrenos onde houver gatos. Eles carregam germes que podem causar defeitos de nascena no beb e possivelmente a perda do feto por aborto espontneo ou parto de natimorto.  Evite fumar, usar plantas medicinais, lcool e medicamentos no prescritos. As substncias qumicas desses produtos podem afetar a formao e o crescimento do beb.  No use nenhum produto que contenha nicotina ou  tabaco, como cigarros e cigarros eletrnicos. Caso precise de ajuda para parar de fumar, fale com seu mdico.  Consulte seu dentista se ainda no o tiver [employer]. Use uma escova de dentes macia para escovar os dentes e use o fio dental com cuidado. Entre em contato com um mdico se:  Tiver vertigem.  Tiver cibra plvica suave, presso plvica ou dor persistente na regio abdominal.  Ocorrerem enjoo, vmito ou diarreia persistentes.  Ocorrer corrimento vaginal com cheiro ruim.  Sentir dor Engineer, maintenance (IT). Tomasa Hose ajuda imediatamente se:  Tiver febre.  Notar secreo em sua vagina.  Perceber sangramento de escape ou hemorragia vaginal.  Sentir dor ou cibra intensas no abdome.  Sofrer perda ou ganho sbito de [place].  Tiver falta de ar com dor no peito.  Perceber inchao repentino ou extremo em seu rosto, mos, tornozelos, ps ou pernas.  No sentir seu beb se mexer por mais de uma hora.  Tiver dores de cabea intensas que no passam com medicao.  Sua viso ficar alterada. Resumo  O segundo trimestre da gestao vai da semana 14 at a semana 27 (do 4 ao 6 ms). Ele tambm  um perodo no qual o feto se desenvolve rapidamente.  Seu corpo passa por muitas mudanas durante a gestao. As mudanas variam de mulher para [employer].  Evite fumar, usar plantas medicinais, lcool e medicamentos no prescritos. Essas substncias qumicas afetam a formao e crescimento do beb.  No use nenhum produto de tabaco, como cigarros, tabaco de Theatre manager e cigarros eletrnicos. Caso precise de ajuda para parar de fumar, fale com seu mdico.  Caso tenha perguntas, entre em contato com o seu mdico. Comparea a todas as suas consultas como orientado pelo seu mdico. Isso  importante. Estas informaes no se destinam a substituir as recomendaes de seu mdico. No deixe de discutir quaisquer dvidas com seu mdico. Document Released: 06/18/2015 Document Revised: 09/26/2016  Document Reviewed: 09/26/2016 Elsevier Patient Education  2020 [employer].

## 2019-06-02 NOTE — Progress Notes (Signed)
Subjective:   Christine Hodge is a 28 y.o. G4P3003 at [redacted]w[redacted]d by midtrimester ultrasound being seen today for her first obstetrical visit.  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 Patient does intend to breast feed. Pregnancy history fully reviewed.  Patient reports no complaints.  HISTORY: OB History  Gravida Para Term Preterm AB Living  4 3 3  0 0 3  SAB TAB Ectopic Multiple Live Births  0 0 0 0 3    # Outcome Date GA Lbr Len/2nd Weight Sex Delivery Anes PTL Lv  4 Current           3 Term 06/13/17 [redacted]w[redacted]d 06:18 / 00:19 8 lb 10.8 oz (3.935 kg) F Vag-Spont None  LIV     Name: Christine Hodge     Apgar1: 9  Apgar5: 9  2 Term 03/30/13    M Vag-Spont   LIV  1 Term 12/15/10    F Vag-Spont   LIV   Past Medical History:  Diagnosis Date  . Headache   . Medical history non-contributory    Past Surgical History:  Procedure Laterality Date  . NO PAST SURGERIES     Family History  Problem Relation Age of Onset  . Depression Mother   . Hypertension Mother   . Diabetes Mother    Social History   Tobacco Use  . Smoking status: Never Smoker  . Smokeless tobacco: Never Used  Substance Use Topics  . Alcohol use: Not Currently    Alcohol/week: 0.0 standard drinks  . Drug use: No   No Known Allergies Current Outpatient Medications on File Prior to Visit  Medication Sig Dispense Refill  . ibuprofen (ADVIL,MOTRIN) 200 MG tablet Take 400 mg by mouth every 6 (six) hours as needed for moderate pain.    . Multiple Vitamin (MULTIVITAMIN WITH MINERALS) TABS tablet Take 1 tablet by mouth daily.     Current Facility-Administered Medications on File Prior to Visit  Medication Dose  Route Frequency Provider Last Rate Last Admin  . medroxyPROGESTERone (DEPO-PROVERA) injection 150 mg  150 mg Intramuscular Q90 days 02/15/11 A, CNM   150 mg at 11/09/17 1203    Exam   Vitals:   06/02/19 1351  BP: 105/67  Pulse: 87  Temp: 98.1 F (36.7 C)  Weight: 191 lb 8 oz (86.9 kg)   Fetal Heart Rate (bpm): 146  Uterus:     Pelvic Exam: Perineum: no hemorrhoids, normal perineum   Vulva: normal external genitalia, no lesions   Vagina:  normal mucosa, normal discharge   Cervix: no lesions and normal, pap smear done.    Adnexa: normal adnexa and no mass, fullness, tenderness   Bony Pelvis: average  System: General: well-developed, well-nourished female in no acute distress   Breast:  normal appearance, no masses or tenderness   Skin: normal coloration and turgor, no rashes   Neurologic: oriented, normal, negative, normal mood   Extremities: normal strength, tone, and muscle mass, ROM of all joints is normal   HEENT PERRLA, extraocular movement intact and sclera clear, anicteric   Mouth/Teeth mucous membranes moist, pharynx normal without lesions and dental hygiene good   Neck supple and no masses   Cardiovascular: regular rate and rhythm   Respiratory:  no respiratory  distress, normal breath sounds   Abdomen: soft, non-tender; bowel sounds normal; no masses,  no organomegaly     Assessment:   Pregnancy: [id number] Patient Active Problem List   Diagnosis Date Noted  . Supervision of normal pregnancy, antepartum 06/02/2019  . Depression 07/09/2018  . 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 Maternal excessive weight gain, third trimester 04/04/2017  . Hemorrhoids during pregnancy in second trimester 02/12/2017  . Hypopigmentation 07/30/2015     Plan:  1. Encounter for supervision of normal pregnancy, antepartum, unspecified gravidity --Anticipatory guidance about next visits/weeks of pregnancy given. --Next visit in 4 weeks  virtual, then at 27-28 weeks in office for GTT  - AFP, Serum, Open Spina Bifida - Enroll Patient in Babyscripts - Babyscripts Schedule Optimization - Obstetric Panel, Including HIV - Culture, OB Urine - Cervicovaginal ancillary only( [place]) - Genetic Screening   2. Itching in the vaginal area --Exam c/w yeast, cervicovaginal swab pending - terconazole (TERAZOL 7) 0.4 % vaginal cream; Place 1 applicator vaginally at bedtime.  Dispense: 45 g; Refill: 0   Initial labs drawn. Continue prenatal vitamins. Discussed and offered genetic screening options, including Quad screen/AFP, NIPS testing, and option to decline testing. Benefits/risks/alternatives reviewed. Pt aware that anatomy US is form of genetic screening with lower accuracy in detecting trisomies than blood work.  Pt chooses genetic screening today. NIPS: ordered. Ultrasound discussed; fetal anatomic survey: results reviewed. Problem list reviewed and updated. The nature of [name] with multiple MDs and other Advanced Practice Providers was explained to patient; also emphasized that residents, students are part of our team. Routine obstetric precautions reviewed. Return in about 4 weeks (around 06/30/2019).   Fatima Blank, CNM 06/02/19 3:03 PM

## 2019-06-02 NOTE — Progress Notes (Signed)
NOB.  Patient wants to speak directly to Provider about her concerns.  FLU vaccine declined.

## 2019-06-03 LAB — CERVICOVAGINAL ANCILLARY ONLY
Bacterial Vaginitis (gardnerella): POSITIVE — AB
Candida Glabrata: NEGATIVE
Candida Vaginitis: NEGATIVE
Chlamydia: POSITIVE — AB
Comment: NEGATIVE
Comment: NEGATIVE
Comment: NEGATIVE
Comment: NEGATIVE
Comment: NEGATIVE
Comment: NORMAL
Neisseria Gonorrhea: NEGATIVE
Trichomonas: NEGATIVE

## 2019-06-03 LAB — CYTOLOGY - PAP
Diagnosis: NEGATIVE
Diagnosis: REACTIVE

## 2019-06-04 DIAGNOSIS — A568 Sexually transmitted chlamydial infection of other sites: Secondary | ICD-10-CM | POA: Insufficient documentation

## 2019-06-04 LAB — OBSTETRIC PANEL, INCLUDING HIV
Antibody Screen: NEGATIVE
Basophils Absolute: 0 10*3/uL (ref 0.0–0.2)
Basos: 1 %
EOS (ABSOLUTE): 0.4 10*3/uL (ref 0.0–0.4)
Eos: 6 %
HIV Screen 4th Generation wRfx: NONREACTIVE
Hematocrit: 31.9 % — ABNORMAL LOW (ref 34.0–46.6)
Hemoglobin: 10.5 g/dL — ABNORMAL LOW (ref 11.1–15.9)
Hepatitis B Surface Ag: NEGATIVE
Immature Grans (Abs): 0 10*3/uL (ref 0.0–0.1)
Immature Granulocytes: 0 %
Lymphocytes Absolute: 2.4 10*3/uL (ref 0.7–3.1)
Lymphs: 32 %
MCH: 26.1 pg — ABNORMAL LOW (ref 26.6–33.0)
MCHC: 32.9 g/dL (ref 31.5–35.7)
MCV: 79 fL (ref 79–97)
Monocytes Absolute: 0.6 10*3/uL (ref 0.1–0.9)
Monocytes: 9 %
Neutrophils Absolute: 3.8 10*3/uL (ref 1.4–7.0)
Neutrophils: 52 %
Platelets: 241 10*3/uL (ref 150–450)
RBC: 4.02 x10E6/uL (ref 3.77–5.28)
RDW: 15.2 % (ref 11.7–15.4)
RPR Ser Ql: NONREACTIVE
Rh Factor: POSITIVE
Rubella Antibodies, IGG: 6.32 index (ref >0.99)
WBC: 7.3 10*3/uL (ref 3.4–10.8)

## 2019-06-04 LAB — AFP, SERUM, OPEN SPINA BIFIDA
AFP MoM: 0.69
AFP Value: 46.8 ng/mL
Gest. Age on Collection Date: 21.7 weeks
Maternal Age At EDD: 28.2 yr
OSBR Risk 1 IN: 10000
Test Results:: NEGATIVE
Weight: 191 [lb_av]

## 2019-06-04 MED ORDER — AZITHROMYCIN 500 MG PO TABS: 1000.0000 mg | ORAL_TABLET | Freq: Once | ORAL | 1 refills | Status: AC

## 2019-06-04 NOTE — Addendum Note (Signed)
Addended by: Fatima Blank A on: 06/04/2019 09:06 AM   Modules accepted: Orders

## 2019-06-06 NOTE — L&D Delivery Note (Addendum)
OB/GYN Faculty Practice Delivery Note  Christine Hodge is a 29 y.o. [id number] s/p VD at 106w4d. She was admitted for SROM.   ROM: 14h 6m with clear fluid GBS Status: GBS Positive/-- (12/28 0000), adequate PCN given Maximum Maternal Temperature: 98.56F  Labor Progress: . Initial SVE: Arrived after SROM at 6/60/-2. Pitocin started at 1756. She then progressed to complete.   Delivery Date/Time: 10/12/19 @ 2051 Delivery: Called to room and patient was complete and pushing. Head delivered LOA. Nuchal cord present x 1, delivered through with summersault maneuver and easily reducible thereafter. Shoulder and body delivered in usual fashion. Infant with spontaneous cry, placed on mother's abdomen, dried and stimulated. Cord clamped x 2 after 1-minute delay, and cut by FOB. Cord blood drawn. Placenta delivered spontaneously with gentle cord traction. Fundus firm with massage and Pitocin. Labia, perineum, vagina, and cervix inspected inspected without lacerations.  Baby Weight: pending  Placenta: Sent to L&D Complications: None Lacerations: None  EBL: 105 mL Analgesia: Epidural  Infant:  APGAR (1 MIN):  8 APGAR (5 MINS): [address], DO, PGY1 10/12/2019, 9:02 PM  OB FELLOW DELIVERY ATTESTATION  I was gloved and present for the delivery in its entirety, and I agree with the above resident's note.    Jerilynn Birkenhead, MD [facility] Family Medicine Fellow, [facility] for [employer], [facility]

## 2019-06-07 LAB — CULTURE, OB URINE

## 2019-06-07 LAB — URINE CULTURE, OB REFLEX

## 2019-06-09 DIAGNOSIS — B951 Streptococcus, group B, as the cause of diseases classified elsewhere: Secondary | ICD-10-CM | POA: Insufficient documentation

## 2019-06-09 DIAGNOSIS — O2342 Unspecified infection of urinary tract in pregnancy, second trimester: Secondary | ICD-10-CM | POA: Insufficient documentation

## 2019-06-09 MED ORDER — CEPHALEXIN 500 MG PO CAPS: 500.0000 mg | ORAL_CAPSULE | Freq: Three times a day (TID) | ORAL | 0 refills | Status: DC

## 2019-06-09 NOTE — Addendum Note (Signed)
Addended by: Sharen Counter A on: 06/09/2019 12:18 PM   Modules accepted: Orders

## 2019-06-10 ENCOUNTER — Encounter: Payer: Self-pay | Admitting: Advanced Practice Midwife

## 2019-06-17 ENCOUNTER — Ambulatory Visit (HOSPITAL_COMMUNITY): Payer: PRIVATE HEALTH INSURANCE

## 2019-06-17 ENCOUNTER — Encounter (HOSPITAL_COMMUNITY): Payer: Self-pay

## 2019-07-03 ENCOUNTER — Encounter: Payer: PRIVATE HEALTH INSURANCE | Admitting: Family Medicine

## 2019-07-03 ENCOUNTER — Telehealth (INDEPENDENT_AMBULATORY_CARE_PROVIDER_SITE_OTHER): Payer: PRIVATE HEALTH INSURANCE | Admitting: Family Medicine

## 2019-07-03 ENCOUNTER — Other Ambulatory Visit: Payer: Self-pay

## 2019-07-03 DIAGNOSIS — O2342 Unspecified infection of urinary tract in pregnancy, second trimester: Secondary | ICD-10-CM

## 2019-07-03 DIAGNOSIS — B951 Streptococcus, group B, as the cause of diseases classified elsewhere: Secondary | ICD-10-CM

## 2019-07-03 DIAGNOSIS — O98312 Other infections with a predominantly sexual mode of transmission complicating pregnancy, second trimester: Secondary | ICD-10-CM

## 2019-07-03 DIAGNOSIS — Z3490 Encounter for supervision of normal pregnancy, unspecified, unspecified trimester: Secondary | ICD-10-CM

## 2019-07-03 DIAGNOSIS — A568 Sexually transmitted chlamydial infection of other sites: Secondary | ICD-10-CM

## 2019-07-03 DIAGNOSIS — Z3A26 26 weeks gestation of pregnancy: Secondary | ICD-10-CM

## 2019-07-03 NOTE — Progress Notes (Signed)
Patient reschedule this appointment to virtual visit due to car troubles. Patient doesn't have mychart app.

## 2019-07-03 NOTE — Progress Notes (Signed)
   TELEHEALTH VIRTUAL OBSTETRICS VISIT ENCOUNTER NOTE  I connected with Christine Hodge on 07/03/19 at  1:15 PM EST by telephone at home and verified that I am speaking with the correct person using two identifiers.   I discussed the limitations, risks, security and privacy concerns of performing an evaluation and management service by telephone and the availability of in person appointments. I also discussed with the patient that there may be a patient responsible charge related to this service. The patient expressed understanding and agreed to proceed.  Subjective:  Christine Hodge is a 29 y.o. G4P3003 at [redacted]w[redacted]d being followed for ongoing prenatal care.  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  Patient reports no complaints. Reports fetal movement. Denies any contractions, bleeding or leaking of fluid.   The following portions of the patient's history were reviewed and updated as appropriate: allergies, current medications, past family history, past medical history, past social history, past surgical history and problem list.   Objective:   General:  Alert, oriented and cooperative.   Mental Status: Normal mood and affect perceived. Normal judgment and thought content.  Rest of physical exam deferred due to type of encounter  Assessment and Plan:  Pregnancy: G4P3003 at [redacted]w[redacted]d  1. Encounter for supervision of normal pregnancy, antepartum, unspecified gravidity Good fetal movement  2. Chlamydia trachomatis infection in mother  during second trimester of pregnancy TOC next visit  3. GBS (group b Streptococcus) UTI complicating pregnancy, second trimester Intrapartum PPx.   Preterm labor symptoms and general obstetric precautions including but not limited to vaginal bleeding, contractions, leaking of fluid and fetal movement were reviewed in detail with the patient.  I discussed the assessment and treatment plan with the patient. The patient was provided an opportunity to ask questions and all were answered. The patient agreed with the plan and demonstrated an understanding of the instructions. The patient was advised to call back or seek an in-person office evaluation/go to MAU at [facility] for any urgent or concerning symptoms. Please refer to After Visit Summary for other counseling recommendations.   I provided 7 minutes of non-face-to-face time during this encounter.  No follow-ups on file.  Future Appointments  Date Time Provider Department Center  07/17/2019  9:00 AM Willodean Rosenthal, MD [facility] None    Levie Heritage, DO [facility] for [employer], [facility]

## 2019-07-17 ENCOUNTER — Encounter: Payer: PRIVATE HEALTH INSURANCE | Admitting: Obstetrics & Gynecology

## 2019-07-17 ENCOUNTER — Other Ambulatory Visit: Payer: Self-pay

## 2019-07-17 DIAGNOSIS — Z3480 Encounter for supervision of other normal pregnancy, unspecified trimester: Secondary | ICD-10-CM

## 2019-07-17 DIAGNOSIS — O99019 Anemia complicating pregnancy, unspecified trimester: Secondary | ICD-10-CM

## 2019-07-18 LAB — RPR: RPR Ser Ql: NONREACTIVE

## 2019-07-18 LAB — HIV ANTIBODY (ROUTINE TESTING W REFLEX): HIV Screen 4th Generation wRfx: NONREACTIVE

## 2019-07-18 LAB — CBC
Hematocrit: 28.4 % — ABNORMAL LOW (ref 34.0–46.6)
Hemoglobin: 9.3 g/dL — ABNORMAL LOW (ref 11.1–15.9)
MCH: 25 pg — ABNORMAL LOW (ref 26.6–33.0)
MCHC: 32.7 g/dL (ref 31.5–35.7)
MCV: 76 fL — ABNORMAL LOW (ref 79–97)
Platelets: 200 10*3/uL (ref 150–450)
RBC: 3.72 x10E6/uL — ABNORMAL LOW (ref 3.77–5.28)
RDW: 14.1 % (ref 11.7–15.4)
WBC: 6.9 10*3/uL (ref 3.4–10.8)

## 2019-07-18 LAB — GLUCOSE TOLERANCE, 2 HOURS W/ 1HR
Glucose, 1 hour: 122 mg/dL (ref 65–179)
Glucose, 2 hour: 61 mg/dL — ABNORMAL LOW (ref 65–152)
Glucose, Fasting: 80 mg/dL (ref 65–91)

## 2019-07-23 ENCOUNTER — Encounter: Payer: Self-pay | Admitting: Obstetrics & Gynecology

## 2019-07-23 ENCOUNTER — Ambulatory Visit (INDEPENDENT_AMBULATORY_CARE_PROVIDER_SITE_OTHER): Payer: PRIVATE HEALTH INSURANCE | Admitting: Obstetrics & Gynecology

## 2019-07-23 ENCOUNTER — Other Ambulatory Visit (HOSPITAL_COMMUNITY)
Admission: RE | Admit: 2019-07-23 | Discharge: 2019-07-23 | Disposition: A | Discharge status: Home / Self Care | Payer: Medicaid Other | Source: Ambulatory Visit | Attending: Obstetrics & Gynecology | Admitting: Obstetrics & Gynecology

## 2019-07-23 ENCOUNTER — Other Ambulatory Visit: Payer: Self-pay

## 2019-07-23 VITALS — BP 95/54 | HR 103 | Wt 194.0 lb

## 2019-07-23 DIAGNOSIS — O98313 Other infections with a predominantly sexual mode of transmission complicating pregnancy, third trimester: Secondary | ICD-10-CM

## 2019-07-23 DIAGNOSIS — B951 Streptococcus, group B, as the cause of diseases classified elsewhere: Secondary | ICD-10-CM

## 2019-07-23 DIAGNOSIS — O2343 Unspecified infection of urinary tract in pregnancy, third trimester: Secondary | ICD-10-CM

## 2019-07-23 DIAGNOSIS — Z3A29 29 weeks gestation of pregnancy: Secondary | ICD-10-CM

## 2019-07-23 DIAGNOSIS — Z23 Encounter for immunization: Secondary | ICD-10-CM | POA: Diagnosis not present

## 2019-07-23 DIAGNOSIS — O98312 Other infections with a predominantly sexual mode of transmission complicating pregnancy, second trimester: Secondary | ICD-10-CM | POA: Insufficient documentation

## 2019-07-23 DIAGNOSIS — F320 Major depressive disorder, single episode, mild: Secondary | ICD-10-CM

## 2019-07-23 DIAGNOSIS — A568 Sexually transmitted chlamydial infection of other sites: Secondary | ICD-10-CM

## 2019-07-23 DIAGNOSIS — Z3480 Encounter for supervision of other normal pregnancy, unspecified trimester: Secondary | ICD-10-CM

## 2019-07-23 DIAGNOSIS — O2603 Excessive weight gain in pregnancy, third trimester: Secondary | ICD-10-CM

## 2019-07-23 DIAGNOSIS — O2342 Unspecified infection of urinary tract in pregnancy, second trimester: Secondary | ICD-10-CM

## 2019-07-23 NOTE — Progress Notes (Signed)
   PRENATAL VISIT NOTE  Subjective:  Christine Hodge is a 29 y.o. G4P3003 at [redacted]w[redacted]d being seen today for ongoing prenatal care.  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  Patient reports no complaints.  Contractions: Not present. Vag. Bleeding: None.  Movement: Present. Denies leaking of fluid.   The following portions of the patient's history were reviewed and updated as appropriate: allergies, current medications, past family history, past medical history, past social history, past surgical history and problem list.   Objective:   Vitals:   07/23/19 1312  BP: (!) 95/54  Pulse: (!) 103  Weight: 194 lb (88 kg)    Fetal Status: Fetal Heart Rate (bpm): 133   Movement: Present     General:  Alert, oriented and cooperative. Patient is in no acute distress.  Skin: Skin is warm and dry. No rash noted.   Cardiovascular: Normal heart rate noted  Respiratory: Normal respiratory effort, no problems with respiration noted  Abdomen: Soft, gravid, appropriate for gestational age.  Pain/Pressure: Present     Pelvic: Cervical exam deferred        Extremities: Normal range of motion.  Edema: None  Mental Status: Normal mood and affect. Normal behavior. Normal judgment and thought content.   Assessment and Plan:  Pregnancy: G4P3003 at [redacted]w[redacted]d 1. Supervision of other normal pregnancy, antepartum No problems s. [ - Tdap vaccine greater than or equal to 7yo IM  2. Maternal excessive weight gain, third trimester FH wnl   3. GBS (group b  Streptococcus) UTI complicating pregnancy, second trimester Needs atbx in labor  4. Current mild episode of major depressive disorder   5. Chlamydia trachomatis infection in mother during second trimester of pregnancy TOC sent today  F/u TOC     Preterm labor symptoms and general obstetric precautions including but not limited to vaginal bleeding, contractions, leaking of fluid and fetal movement were reviewed in detail with the patient. Please refer to After Visit Summary for other counseling recommendations.   Return in about 2 weeks (around 08/06/2019) for in person.  No future appointments.  Willodean Rosenthal, MD

## 2019-07-23 NOTE — Addendum Note (Signed)
Addended by: Lorelle Gibbs L on: 07/23/2019 02:01 PM   Modules accepted: Orders

## 2019-07-24 LAB — CERVICOVAGINAL ANCILLARY ONLY
Chlamydia: NEGATIVE
Comment: NEGATIVE
Comment: NORMAL
Neisseria Gonorrhea: NEGATIVE

## 2019-07-28 DIAGNOSIS — O99019 Anemia complicating pregnancy, unspecified trimester: Secondary | ICD-10-CM | POA: Insufficient documentation

## 2019-08-07 ENCOUNTER — Telehealth (INDEPENDENT_AMBULATORY_CARE_PROVIDER_SITE_OTHER): Payer: Medicaid Other | Admitting: Family Medicine

## 2019-08-07 DIAGNOSIS — Z3A31 31 weeks gestation of pregnancy: Secondary | ICD-10-CM | POA: Diagnosis not present

## 2019-08-07 DIAGNOSIS — O98312 Other infections with a predominantly sexual mode of transmission complicating pregnancy, second trimester: Secondary | ICD-10-CM

## 2019-08-07 DIAGNOSIS — Z3483 Encounter for supervision of other normal pregnancy, third trimester: Secondary | ICD-10-CM

## 2019-08-07 DIAGNOSIS — O99019 Anemia complicating pregnancy, unspecified trimester: Secondary | ICD-10-CM

## 2019-08-07 DIAGNOSIS — B951 Streptococcus, group B, as the cause of diseases classified elsewhere: Secondary | ICD-10-CM

## 2019-08-07 DIAGNOSIS — A568 Sexually transmitted chlamydial infection of other sites: Secondary | ICD-10-CM

## 2019-08-07 DIAGNOSIS — Z3480 Encounter for supervision of other normal pregnancy, unspecified trimester: Secondary | ICD-10-CM

## 2019-08-07 MED ORDER — PREPLUS 27-1 MG PO TABS: 1.0000 | ORAL_TABLET | Freq: Every day | ORAL | 3 refills | Status: DC

## 2019-08-07 NOTE — Progress Notes (Signed)
Patient would like the visit to occur via the phone. Armandina Stammer RN

## 2019-08-07 NOTE — Progress Notes (Signed)
   TELEHEALTH VIRTUAL OBSTETRICS VISIT ENCOUNTER NOTE  I connected with Magnus Ivan on 08/07/19 at  2:15 PM EST by telephone at home and verified that I am speaking with the correct person using two identifiers.   I discussed the limitations, risks, security and privacy concerns of performing an evaluation and management service by telephone and the availability of in person appointments. I also discussed with the patient that there may be a patient responsible charge related to this service. The patient expressed understanding and agreed to proceed.  Subjective:  Christine Hodge is a 29 y.o. G4P3003 at [redacted]w[redacted]d being followed for ongoing prenatal care.  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  Patient reports no complaints. Reports fetal movement. Denies any contractions, bleeding or leaking of fluid.   The following portions of the patient's history were reviewed and updated as appropriate: allergies, current medications, past family history, past medical history, past social history, past surgical history and problem list.   Objective:   General:  Alert, oriented and cooperative.   Mental Status: Normal mood and affect perceived. Normal judgment and thought content.  Rest of physical exam deferred due to type of encounter  Assessment and Plan:  Pregnancy: G4P3003 at [redacted]w[redacted]d  1. Supervision of other normal pregnancy, antepartum Good fetal movement  2. Anemia, antepartum On iron  3. Chlamydia trachomatis  infection in mother during second trimester of pregnancy TOC neg  4. GBS (group b Streptococcus) UTI complicating pregnancy, second trimester Intrapartum ppx   Preterm labor symptoms and general obstetric precautions including but not limited to vaginal bleeding, contractions, leaking of fluid and fetal movement were reviewed in detail with the patient.  I discussed the assessment and treatment plan with the patient. The patient was provided an opportunity to ask questions and all were answered. The patient agreed with the plan and demonstrated an understanding of the instructions. The patient was advised to call back or seek an in-person office evaluation/go to MAU at [facility] for any urgent or concerning symptoms. Please refer to After Visit Summary for other counseling recommendations.   I provided 13 minutes of non-face-to-face time during this encounter.  No follow-ups on file.  Future Appointments  Date Time Provider Department Center  08/11/2019  9:45 AM [facility] [place] 5 [facility]    Levie Heritage, DO [facility] [employer], [facility]

## 2019-08-11 ENCOUNTER — Other Ambulatory Visit: Payer: Self-pay

## 2019-08-11 ENCOUNTER — Ambulatory Visit (HOSPITAL_COMMUNITY)
Admission: RE | Admit: 2019-08-11 | Discharge: 2019-08-11 | Disposition: A | Discharge status: Home / Self Care | Payer: Medicaid Other | Source: Ambulatory Visit | Attending: Obstetrics | Admitting: Obstetrics

## 2019-08-11 DIAGNOSIS — Z362 Encounter for other antenatal screening follow-up: Secondary | ICD-10-CM | POA: Diagnosis not present

## 2019-08-11 DIAGNOSIS — Z3A31 31 weeks gestation of pregnancy: Secondary | ICD-10-CM | POA: Diagnosis not present

## 2019-08-28 ENCOUNTER — Ambulatory Visit (INDEPENDENT_AMBULATORY_CARE_PROVIDER_SITE_OTHER): Payer: Medicaid Other | Admitting: Family Medicine

## 2019-08-28 ENCOUNTER — Other Ambulatory Visit: Payer: Self-pay

## 2019-08-28 VITALS — BP 99/56 | HR 97 | Wt 201.0 lb

## 2019-08-28 DIAGNOSIS — O99019 Anemia complicating pregnancy, unspecified trimester: Secondary | ICD-10-CM

## 2019-08-28 DIAGNOSIS — Z3A34 34 weeks gestation of pregnancy: Secondary | ICD-10-CM

## 2019-08-28 DIAGNOSIS — O2603 Excessive weight gain in pregnancy, third trimester: Secondary | ICD-10-CM

## 2019-08-28 DIAGNOSIS — O2342 Unspecified infection of urinary tract in pregnancy, second trimester: Secondary | ICD-10-CM

## 2019-08-28 DIAGNOSIS — O2343 Unspecified infection of urinary tract in pregnancy, third trimester: Secondary | ICD-10-CM

## 2019-08-28 DIAGNOSIS — O99013 Anemia complicating pregnancy, third trimester: Secondary | ICD-10-CM

## 2019-08-28 DIAGNOSIS — Z3480 Encounter for supervision of other normal pregnancy, unspecified trimester: Secondary | ICD-10-CM

## 2019-08-28 DIAGNOSIS — B951 Streptococcus, group B, as the cause of diseases classified elsewhere: Secondary | ICD-10-CM

## 2019-08-28 NOTE — Progress Notes (Signed)
   PRENATAL VISIT NOTE  Subjective:  Christine Hodge is a 29 y.o. G4P3003 at [redacted]w[redacted]d being seen today for ongoing prenatal care.  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  Patient reports no complaints.  Contractions: Not present. Vag. Bleeding: None.  Movement: Present. Denies leaking of fluid.   The following portions of the patient's history were reviewed and updated as appropriate: allergies, current medications, past family history, past medical history, past social history, past surgical history and problem list.   Objective:   Vitals:   08/28/19 1421  BP: (!) 99/56  Pulse: 97  Weight: 201 lb (91.2 kg)    Fetal Status: Fetal Heart Rate (bpm): 141 Fundal Height: 34 cm Movement: Present     General:  Alert, oriented and cooperative. Patient is in no acute distress.  Skin: Skin is warm and dry. No rash noted.   Cardiovascular: Normal heart rate noted  Respiratory: Normal respiratory effort, no problems with respiration noted  Abdomen: Soft, gravid, appropriate for gestational age.  Pain/Pressure: Absent     Pelvic: Cervical exam deferred        Extremities: Normal range of motion.  Edema: None  Mental Status: Normal mood and affect. Normal behavior. Normal judgment and thought content.   Assessment and Plan:  Pregnancy: G4P3003 at [redacted]w[redacted]d 1. Supervision of other normal pregnancy, antepartum FHT and FH normal  2. Anemia, antepartum On iron  3. GBS (group b Streptococcus) UTI complicating pregnancy,  second trimester Intrapartum PPx  4. Maternal excessive weight gain, third trimester  Preterm labor symptoms and general obstetric precautions including but not limited to vaginal bleeding, contractions, leaking of fluid and fetal movement were reviewed in detail with the patient. Please refer to After Visit Summary for other counseling recommendations.   Return in about 2 weeks (around 09/11/2019) for OB f/u.  Future Appointments  Date Time Provider Department Center  09/11/2019  1:15 PM Levie Heritage, DO [facility] None    Levie Heritage, DO

## 2019-09-11 ENCOUNTER — Encounter: Payer: Medicaid Other | Admitting: Family Medicine

## 2019-09-12 ENCOUNTER — Other Ambulatory Visit (HOSPITAL_COMMUNITY)
Admission: RE | Admit: 2019-09-12 | Discharge: 2019-09-12 | Disposition: A | Discharge status: Home / Self Care | Payer: Medicaid Other | Source: Ambulatory Visit | Attending: Family Medicine | Admitting: Family Medicine

## 2019-09-12 ENCOUNTER — Ambulatory Visit (INDEPENDENT_AMBULATORY_CARE_PROVIDER_SITE_OTHER): Payer: Medicaid Other | Admitting: Family Medicine

## 2019-09-12 ENCOUNTER — Other Ambulatory Visit: Payer: Self-pay

## 2019-09-12 VITALS — BP 93/57 | HR 94 | Wt 203.0 lb

## 2019-09-12 DIAGNOSIS — O99019 Anemia complicating pregnancy, unspecified trimester: Secondary | ICD-10-CM

## 2019-09-12 DIAGNOSIS — D649 Anemia, unspecified: Secondary | ICD-10-CM

## 2019-09-12 DIAGNOSIS — Z3490 Encounter for supervision of normal pregnancy, unspecified, unspecified trimester: Secondary | ICD-10-CM | POA: Diagnosis not present

## 2019-09-12 DIAGNOSIS — A568 Sexually transmitted chlamydial infection of other sites: Secondary | ICD-10-CM

## 2019-09-12 DIAGNOSIS — O2342 Unspecified infection of urinary tract in pregnancy, second trimester: Secondary | ICD-10-CM

## 2019-09-12 DIAGNOSIS — O98313 Other infections with a predominantly sexual mode of transmission complicating pregnancy, third trimester: Secondary | ICD-10-CM

## 2019-09-12 DIAGNOSIS — Z3A36 36 weeks gestation of pregnancy: Secondary | ICD-10-CM

## 2019-09-12 DIAGNOSIS — O99013 Anemia complicating pregnancy, third trimester: Secondary | ICD-10-CM

## 2019-09-12 DIAGNOSIS — O2343 Unspecified infection of urinary tract in pregnancy, third trimester: Secondary | ICD-10-CM

## 2019-09-12 DIAGNOSIS — B951 Streptococcus, group B, as the cause of diseases classified elsewhere: Secondary | ICD-10-CM

## 2019-09-12 MED ORDER — FERROUS GLUCONATE 324 (38 FE) MG PO TABS: 324.0000 mg | ORAL_TABLET | Freq: Every day | ORAL | 3 refills | Status: DC

## 2019-09-12 NOTE — Progress Notes (Signed)
   PRENATAL VISIT NOTE  Subjective:  Christine Hodge is a 29 y.o. G4P3003 at [redacted]w[redacted]d being seen today for ongoing prenatal care.  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  Patient reports occasional contractions.  Contractions: Not present. Vag. Bleeding: None.  Movement: Present. Denies leaking of fluid.   The following portions of the patient's history were reviewed and updated as appropriate: allergies, current medications, past family history, past medical history, past social history, past surgical history and problem list.   Objective:   Vitals:   09/12/19 0902  BP: (!) 93/57  Pulse: 94  Weight: 203 lb (92.1 kg)    Fetal Status: Fetal Heart Rate (bpm): 123 Fundal Height: 36 cm Movement: Present  Presentation: Vertex  General:  Alert, oriented and cooperative. Patient is in no acute distress.  Skin: Skin is warm and dry. No rash noted.   Cardiovascular: Normal heart rate noted  Respiratory: Normal respiratory effort, no problems with respiration noted  Abdomen: Soft, gravid, appropriate for gestational age.  Pain/Pressure: Absent     Pelvic: Cervical exam performed in the presence of a chaperone Dilation: 1 Effacement (%): 50 Station: -3  Extremities: Normal range of motion.  Edema: None  Mental Status: Normal mood and affect. Normal behavior. Normal judgment and thought content.   Assessment and Plan:  Pregnancy: G4P3003 at [redacted]w[redacted]d 1. Encounter for supervision of normal pregnancy, antepartum, unspecified  gravidity FHT and FH normal  2. Anemia, antepartum Iron prescribed  3. GBS (group b Streptococcus) UTI complicating pregnancy, second trimester Intrapartum PPX  4. Chlamydia trachomatis infection in mother during second trimester of pregnancy Recheck today   Preterm labor symptoms and general obstetric precautions including but not limited to vaginal bleeding, contractions, leaking of fluid and fetal movement were reviewed in detail with the patient. Please refer to After Visit Summary for other counseling recommendations.   Return in about 1 week (around 09/19/2019) for OB f/u.  No future appointments.  Levie Heritage, DO

## 2019-09-15 LAB — GC/CHLAMYDIA PROBE AMP (~~LOC~~) NOT AT ARMC
Chlamydia: NEGATIVE
Comment: NEGATIVE
Comment: NORMAL
Neisseria Gonorrhea: NEGATIVE

## 2019-09-19 ENCOUNTER — Other Ambulatory Visit: Payer: Self-pay

## 2019-09-19 ENCOUNTER — Ambulatory Visit (INDEPENDENT_AMBULATORY_CARE_PROVIDER_SITE_OTHER): Payer: Medicaid Other | Admitting: Family Medicine

## 2019-09-19 VITALS — BP 98/50 | HR 99 | Wt 206.0 lb

## 2019-09-19 DIAGNOSIS — Z3490 Encounter for supervision of normal pregnancy, unspecified, unspecified trimester: Secondary | ICD-10-CM

## 2019-09-19 DIAGNOSIS — A568 Sexually transmitted chlamydial infection of other sites: Secondary | ICD-10-CM

## 2019-09-19 DIAGNOSIS — O99019 Anemia complicating pregnancy, unspecified trimester: Secondary | ICD-10-CM

## 2019-09-19 DIAGNOSIS — O2342 Unspecified infection of urinary tract in pregnancy, second trimester: Secondary | ICD-10-CM

## 2019-09-19 DIAGNOSIS — B951 Streptococcus, group B, as the cause of diseases classified elsewhere: Secondary | ICD-10-CM

## 2019-09-19 DIAGNOSIS — O98312 Other infections with a predominantly sexual mode of transmission complicating pregnancy, second trimester: Secondary | ICD-10-CM

## 2019-09-19 NOTE — Progress Notes (Signed)
   PRENATAL VISIT NOTE  Subjective:  Christine Hodge is a 29 y.o. G4P3003 at [redacted]w[redacted]d being seen today for ongoing prenatal care.  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  Patient reports occasional contractions.  Contractions: Not present. Vag. Bleeding: None.  Movement: Present. Denies leaking of fluid.   The following portions of the patient's history were reviewed and updated as appropriate: allergies, current medications, past family history, past medical history, past social history, past surgical history and problem list.   Objective:   Vitals:   09/19/19 1001  BP: (!) 98/50  Pulse: 99  Weight: 206 lb (93.4 kg)    Fetal Status: Fetal Heart Rate (bpm): 135 Fundal Height: 37 cm Movement: Present  Presentation: Vertex  General:  Alert, oriented and cooperative. Patient is in no acute distress.  Skin: Skin is warm and dry. No rash noted.   Cardiovascular: Normal heart rate noted  Respiratory: Normal respiratory effort, no problems with respiration noted  Abdomen: Soft, gravid, appropriate for gestational age.  Pain/Pressure: Present     Pelvic: Cervical exam performed in the presence of a chaperone Dilation: 1.5 Effacement (%): 50 Station: Ballotable  Extremities: Normal range of motion.  Edema: None  Mental Status: Normal mood and affect. Normal behavior. Normal judgment and thought content.   Assessment and Plan:  Pregnancy: G4P3003 at [redacted]w[redacted]d  1. Encounter for supervision of normal pregnancy, antepartum,  unspecified gravidity FHT and Fh normal  2. GBS (group b Streptococcus) UTI complicating pregnancy, second trimester Intrapartum PPx  3. Chlamydia trachomatis infection in mother during second trimester of pregnancy TOC neg, rpt testing at 36 weeks neg  4. Anemia, antepartum On oral iron   Term labor symptoms and general obstetric precautions including but not limited to vaginal bleeding, contractions, leaking of fluid and fetal movement were reviewed in detail with the patient. Please refer to After Visit Summary for other counseling recommendations.   Return in about 1 week (around 09/26/2019) for OB f/u, In Office.  Future Appointments  Date Time Provider Department Center  09/26/2019 10:30 AM Willodean Rosenthal, MD [facility] None  10/03/2019  9:30 AM Willodean Rosenthal, MD [facility] None    Levie Heritage, DO

## 2019-09-26 ENCOUNTER — Other Ambulatory Visit: Payer: Self-pay

## 2019-09-26 ENCOUNTER — Ambulatory Visit (INDEPENDENT_AMBULATORY_CARE_PROVIDER_SITE_OTHER): Payer: Medicaid Other | Admitting: Obstetrics & Gynecology

## 2019-09-26 VITALS — BP 111/65 | HR 92 | Wt 206.0 lb

## 2019-09-26 DIAGNOSIS — O99013 Anemia complicating pregnancy, third trimester: Secondary | ICD-10-CM

## 2019-09-26 DIAGNOSIS — Z3A38 38 weeks gestation of pregnancy: Secondary | ICD-10-CM

## 2019-09-26 DIAGNOSIS — D649 Anemia, unspecified: Secondary | ICD-10-CM

## 2019-09-26 DIAGNOSIS — Z3480 Encounter for supervision of other normal pregnancy, unspecified trimester: Secondary | ICD-10-CM

## 2019-09-26 DIAGNOSIS — O2343 Unspecified infection of urinary tract in pregnancy, third trimester: Secondary | ICD-10-CM

## 2019-09-26 DIAGNOSIS — O99019 Anemia complicating pregnancy, unspecified trimester: Secondary | ICD-10-CM

## 2019-09-26 DIAGNOSIS — O98312 Other infections with a predominantly sexual mode of transmission complicating pregnancy, second trimester: Secondary | ICD-10-CM

## 2019-09-26 DIAGNOSIS — O98313 Other infections with a predominantly sexual mode of transmission complicating pregnancy, third trimester: Secondary | ICD-10-CM

## 2019-09-26 DIAGNOSIS — B951 Streptococcus, group B, as the cause of diseases classified elsewhere: Secondary | ICD-10-CM

## 2019-09-26 DIAGNOSIS — A568 Sexually transmitted chlamydial infection of other sites: Secondary | ICD-10-CM

## 2019-09-26 NOTE — Progress Notes (Signed)
   PRENATAL VISIT NOTE  Subjective:  Christine Hodge is a 29 y.o. G4P3003 at [redacted]w[redacted]d being seen today for ongoing prenatal care.  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  Patient reports no complaints.  Contractions: Irregular. Vag. Bleeding: None.  Movement: Present. Denies leaking of fluid.   The following portions of the patient's history were reviewed and updated as appropriate: allergies, current medications, past family history, past medical history, past social history, past surgical history and problem list.   Objective:   Vitals:   09/26/19 1036  BP: 111/65  Pulse: 92  Weight: 206 lb (93.4 kg)    Fetal Status:     Movement: Present     General:  Alert, oriented and cooperative. Patient is in no acute distress.  Skin: Skin is warm and dry. No rash noted.   Cardiovascular: Normal heart rate noted  Respiratory: Normal respiratory effort, no problems with respiration noted  Abdomen: Soft, gravid, appropriate for gestational age.  Pain/Pressure: Present     Pelvic: Cervical exam deferred        Extremities: Normal range of motion.  Edema: None  Mental Status: Normal mood and affect. Normal behavior. Normal judgment and thought content.   Assessment and Plan:  Pregnancy: G4P3003 at 104w2d 1. Supervision of other normal pregnancy, antepartum FH and FHR WNL   2. Anemia, antepartum On [place]  3. GBS (group b Streptococcus) UTI complicating pregnancy, second trimester Needs atbx in labor.    4. Chlamydia trachomatis infection in mother during second trimester of pregnancy TOC neg  Term labor  symptoms and general obstetric precautions including but not limited to vaginal bleeding, contractions, leaking of fluid and fetal movement were reviewed in detail with the patient. Please refer to After Visit Summary for other counseling recommendations.   Return in about 1 week (around 10/03/2019).  Future Appointments  Date Time Provider Department Center  10/03/2019  9:30 AM Willodean Rosenthal, MD [facility] None    Willodean Rosenthal, MD

## 2019-10-03 ENCOUNTER — Other Ambulatory Visit: Payer: Self-pay

## 2019-10-03 ENCOUNTER — Ambulatory Visit (INDEPENDENT_AMBULATORY_CARE_PROVIDER_SITE_OTHER): Payer: Medicaid Other | Admitting: Obstetrics & Gynecology

## 2019-10-03 ENCOUNTER — Other Ambulatory Visit (HOSPITAL_COMMUNITY)
Admission: RE | Admit: 2019-10-03 | Discharge: 2019-10-03 | Disposition: A | Discharge status: Home / Self Care | Payer: Medicaid Other | Source: Ambulatory Visit | Attending: Obstetrics & Gynecology | Admitting: Obstetrics & Gynecology

## 2019-10-03 VITALS — BP 107/83 | HR 90 | Wt 205.0 lb

## 2019-10-03 DIAGNOSIS — O98312 Other infections with a predominantly sexual mode of transmission complicating pregnancy, second trimester: Secondary | ICD-10-CM | POA: Insufficient documentation

## 2019-10-03 DIAGNOSIS — B951 Streptococcus, group B, as the cause of diseases classified elsewhere: Secondary | ICD-10-CM

## 2019-10-03 DIAGNOSIS — O99019 Anemia complicating pregnancy, unspecified trimester: Secondary | ICD-10-CM

## 2019-10-03 DIAGNOSIS — O368130 Decreased fetal movements, third trimester, not applicable or unspecified: Secondary | ICD-10-CM

## 2019-10-03 DIAGNOSIS — A568 Sexually transmitted chlamydial infection of other sites: Secondary | ICD-10-CM | POA: Insufficient documentation

## 2019-10-03 DIAGNOSIS — Z3480 Encounter for supervision of other normal pregnancy, unspecified trimester: Secondary | ICD-10-CM

## 2019-10-03 DIAGNOSIS — Z3A39 39 weeks gestation of pregnancy: Secondary | ICD-10-CM | POA: Diagnosis not present

## 2019-10-03 DIAGNOSIS — O99820 Streptococcus B carrier state complicating pregnancy: Secondary | ICD-10-CM

## 2019-10-03 NOTE — Progress Notes (Signed)
AFI 11.3 cm Armandina Stammer RN

## 2019-10-03 NOTE — Progress Notes (Signed)
   PRENATAL VISIT NOTE  Subjective:  Christine Hodge is a 29 y.o. G4P3003 at [redacted]w[redacted]d being seen today for ongoing prenatal care.  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  Patient reports decreased fetal movement. .  Contractions: Irritability. Vag. Bleeding: None.  Movement: (!) Decreased. Denies leaking of fluid.   The following portions of the patient's history were reviewed and updated as appropriate: allergies, current medications, past family history, past medical history, past social history, past surgical history and problem list.   Objective:   Vitals:   10/03/19 0909  BP: 107/83  Pulse: 90  Weight: 205 lb (93 kg)    Fetal Status:     Movement: (!) Decreased     General:  Alert, oriented and cooperative. Patient is in no acute distress.  Skin: Skin is warm and dry. No rash noted.   Cardiovascular: Normal heart rate noted  Respiratory: Normal respiratory effort, no problems with respiration noted  Abdomen: Soft, gravid, appropriate for gestational age.  Pain/Pressure: Present     Pelvic: Cervical exam deferred        Extremities: Normal range of motion.  Edema: None  Mental Status: Normal mood and affect. Normal behavior. Normal judgment and thought content.   Assessment and Plan:  Pregnancy: G4P3003 at [redacted]w[redacted]d 1. Supervision of other normal pregnancy, antepartum FH and FHR wnl.  NST reviewed and reactive. AFI: 11.3cm    Schedule IOL at 41 weeks per pt request.    2. Anemia, antepartum  3. GBS (group b Streptococcus) UTI complicating pregnancy, second trimester pos in urine. Need atbx in  labor  4. Chlamydia trachomatis infection in mother during second trimester of pregnancy Partner not treated. Pt concerned.    Term labor symptoms and general obstetric precautions including but not limited to vaginal bleeding, contractions, leaking of fluid and fetal movement were reviewed in detail with the patient. Please refer to After Visit Summary for other counseling recommendations.   Return in about 1 week (around 10/10/2019).  No future appointments.  Willodean Rosenthal, MD

## 2019-10-06 LAB — GC/CHLAMYDIA PROBE AMP (~~LOC~~) NOT AT ARMC
Chlamydia: NEGATIVE
Comment: NEGATIVE
Comment: NORMAL
Neisseria Gonorrhea: NEGATIVE

## 2019-10-08 ENCOUNTER — Encounter (HOSPITAL_COMMUNITY): Payer: Self-pay | Admitting: *Deleted

## 2019-10-08 ENCOUNTER — Other Ambulatory Visit: Payer: Self-pay | Admitting: Advanced Practice Midwife

## 2019-10-08 ENCOUNTER — Telehealth (HOSPITAL_COMMUNITY): Payer: Self-pay | Admitting: *Deleted

## 2019-10-08 NOTE — Telephone Encounter (Signed)
Preadmission screen  

## 2019-10-10 ENCOUNTER — Other Ambulatory Visit (HOSPITAL_COMMUNITY)
Admission: RE | Admit: 2019-10-10 | Discharge: 2019-10-10 | Disposition: A | Discharge status: Home / Self Care | Payer: Medicaid Other | Source: Ambulatory Visit | Attending: Obstetrics & Gynecology | Admitting: Obstetrics & Gynecology

## 2019-10-10 ENCOUNTER — Ambulatory Visit (INDEPENDENT_AMBULATORY_CARE_PROVIDER_SITE_OTHER): Payer: Medicaid Other | Admitting: Obstetrics & Gynecology

## 2019-10-10 ENCOUNTER — Other Ambulatory Visit: Payer: Self-pay

## 2019-10-10 VITALS — BP 108/61 | HR 102 | Wt 210.0 lb

## 2019-10-10 DIAGNOSIS — O480 Post-term pregnancy: Secondary | ICD-10-CM

## 2019-10-10 DIAGNOSIS — O23593 Infection of other part of genital tract in pregnancy, third trimester: Secondary | ICD-10-CM | POA: Insufficient documentation

## 2019-10-10 DIAGNOSIS — Z3480 Encounter for supervision of other normal pregnancy, unspecified trimester: Secondary | ICD-10-CM

## 2019-10-10 DIAGNOSIS — N760 Acute vaginitis: Secondary | ICD-10-CM | POA: Diagnosis not present

## 2019-10-10 NOTE — Progress Notes (Signed)
   PRENATAL VISIT NOTE  Subjective:  Christine Hodge is a 29 y.o. G4P3003 at [redacted]w[redacted]d being seen today for ongoing prenatal care.  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  Patient reports occasional contractions.  Also had some vulvar itching 2 days ago, nothing currently but wants evaluation.  Contractions: Irregular. Vag. Bleeding: None.  Movement: Present. Denies leaking of fluid.   The following portions of the patient's history were reviewed and updated as appropriate: allergies, current medications, past family history, past medical history, past social history, past surgical history and problem list.   Objective:   Vitals:   10/10/19 0952  BP: 108/61  Pulse: (!) 102  Weight: 210 lb (95.3 kg)    Fetal Status: Fetal Heart Rate (bpm): 130 Fundal Height: 39 cm Movement: Present  Presentation: Vertex  General:  Alert, oriented and cooperative. Patient is in no acute distress.  Skin: Skin is warm and dry. No rash noted.   Cardiovascular: Normal heart rate noted  Respiratory: Normal respiratory effort, no problems with respiration noted  Abdomen: Soft, gravid, appropriate for gestational age.  Pain/Pressure: Present     Pelvic: Cervical exam performed in the presence of a chaperone Dilation: 1.5 Effacement (%): 50 Station: Ballotable  Extremities: Normal range of motion.  Edema: None  Mental Status: Normal mood and affect. Normal behavior. Normal judgment and thought content.   Assessment and Plan:  Pregnancy: G4P3003 at [redacted]w[redacted]d 1. Vaginitis affecting pregnancy in third trimester, antepartum Normal  examination, testing done. Will follow up results and manage accordingly. - Cervicovaginal ancillary only( [place])  2. Post term pregnancy over 40 weeks 3. Supervision of other normal pregnancy, antepartum - Fetal nonstress test - US OB Limited; Future NST performed today was reviewed and was found to be reactive.  AFI was also normal at 15.08cm. These were done in office because BPP was unable to be scheduled in MFM in a timely manner. IOL already scheduled at 41 weeks, 10/15/19. Offered outpatient foley placement for cervical ripening, she desires this, this was scheduled at [facility] MAU on 10/14/19 at 5pm. Pre-admission COVID screening and any further instructions about the induction of labor have been reviewed with patient. Orders signed and held.    Term labor symptoms and general obstetric precautions including but not limited to vaginal bleeding, contractions, leaking of fluid and fetal movement were reviewed in detail with the patient. Please refer to After Visit Summary for other counseling recommendations.   Return in about 5 weeks (around 11/14/2019) for Postpartum check.  Future Appointments  Date Time Provider Department Center  10/13/2019  9:40 AM [facility]-SCREENING [facility] None  10/15/2019  6:30 AM MC-LD SCHED ROOM [facility] None  11/13/2019  1:30 PM Levie Heritage, DO [facility] None    [name], MD

## 2019-10-10 NOTE — Patient Instructions (Addendum)
Return to office for any scheduled appointments. Call the office or go to the MAU at [place] at [facility] if:  You begin to have strong, frequent contractions  Your water breaks.  Sometimes it is a big gush of fluid, sometimes it is just a trickle that keeps getting your panties wet or running down your legs  You have vaginal bleeding.  It is normal to have a small amount of spotting if your cervix was checked.   You do not feel your baby moving like normal.  If you do not, get something to eat and drink and lay down and focus on feeling your baby move.   If your baby is still not moving like normal, you should call the office or go to MAU.  Any other obstetric concerns.  OUTPATIENT FOLEY BULB INDUCTION OF LABOR:  Information Sheet for Mothers and Family               What's a Foley Bulb Induction? A Foley bulb induction is a procedure where your provider inserts a catheter into your cervix. Once inside your womb, your provider inflates the balloon with a saline solution.   This puts pressure on your cervix and encourages dilation. The catheter falls out once your cervix dilates to 3-4 centimeters.     With any procedure, it's important that you know what to expect. The insertion of a Foley catheter can be a bit uncomfortable, and some women experience sharp pelvic pain. The pain may subside once the catheter is in place. You may experience some cramping when the Foley catheter is in place.  This is normal.     GO TO THE MATERNITY ADMISSIONS UNIT FOR THE FOLLOWING:  Heavy vaginal bleeding  Rupture of membranes (fluid that wets your underwear)  Painful uterine contractions every 5 minutes or less  Severe abdominal discomfort  Decreased movement of the baby   Labor Induction  Labor induction is when steps are taken to cause a pregnant woman to begin the labor process. Most women go into labor on their own between 37 weeks and 42 weeks of pregnancy. When this  does not happen or when there is a medical need for labor to begin, steps may be taken to induce labor. Labor induction causes a pregnant woman's uterus to contract. It also causes the cervix to soften (ripen), open (dilate), and thin out (efface). Usually, labor is not induced before 39 weeks of pregnancy unless there is a medical reason to do so. Your health care provider will determine if labor induction is needed. Before inducing labor, your health care provider will consider a number of factors, including:  Your medical condition and your baby's.  How many weeks along you are in your pregnancy.  How mature your baby's lungs are.  The condition of your cervix.  The position of your baby.  The size of your birth canal. What are some reasons for labor induction? Labor may be induced if:  Your health or your baby's health is at risk.  Your pregnancy is overdue by 1 week or more.  Your water breaks but labor does not start on its own.  There is a low amount of amniotic fluid around your baby. You may also choose (elect) to have labor induced at a certain time. Generally, elective labor induction is done no earlier than 39 weeks of pregnancy. What methods are used for labor induction? Methods used for labor induction include:  Prostaglandin medicine. This medicine  starts contractions and causes the cervix to dilate and ripen. It can be taken by mouth (orally) or by being inserted into the vagina (suppository).  Inserting a small, thin tube (catheter) with a balloon into the vagina and then expanding the balloon with water to dilate the cervix.  Stripping the membranes. In this method, your health care provider gently separates amniotic sac tissue from the cervix. This causes the cervix to stretch, which in turn causes the release of a hormone called progesterone. The hormone causes the uterus to contract. This procedure is often done during an office visit, after which you will be sent  home to wait for contractions to begin.  Breaking the water. In this method, your health care provider uses a small instrument to make a small hole in the amniotic sac. This eventually causes the amniotic sac to break. Contractions should begin after a few hours.  Medicine to trigger or strengthen contractions. This medicine is given through an IV that is inserted into a vein in your arm. Except for membrane stripping, which can be done in a clinic, labor induction is done in the hospital so that you and your baby can be carefully monitored. How long does it take for labor to be induced? The length of time it takes to induce labor depends on how ready your body is for labor. Some inductions can take up to 2-3 days, while others may take less than a day. Induction may take longer if:  You are induced early in your pregnancy.  It is your first pregnancy.  Your cervix is not ready. What are some risks associated with labor induction? Some risks associated with labor induction include:  Changes in fetal heart rate, such as being too high, too low, or irregular (erratic).  Failed induction.  Infection in the mother or the baby.  Increased risk of having a cesarean delivery.  Fetal death.  Breaking off (abruption) of the placenta from the uterus (rare).  Rupture of the uterus (very rare). When induction is needed for medical reasons, the benefits of induction generally outweigh the risks. What are some reasons for not inducing labor? Labor induction should not be done if:  Your baby does not tolerate contractions.  You have had previous surgeries on your uterus, such as a myomectomy, removal of fibroids, or a vertical scar from a previous cesarean delivery.  Your placenta lies very low in your uterus and blocks the opening of the cervix (placenta previa).  Your baby is not in a head-down position.  The umbilical cord drops down into the birth canal in front of the baby.  There  are unusual circumstances, such as the baby being very early (premature).  You have had more than 2 previous cesarean deliveries. Summary  Labor induction is when steps are taken to cause a pregnant woman to begin the labor process.  Labor induction causes a pregnant woman's uterus to contract. It also causes the cervix to ripen, dilate, and efface.  Labor is not induced before 39 weeks of pregnancy unless there is a medical reason to do so.  When induction is needed for medical reasons, the benefits of induction generally outweigh the risks. This information is not intended to replace advice given to you by your health care provider. Make sure you discuss any questions you have with your health care provider. Document Revised: 05/25/2017 Document Reviewed: 07/05/2016 Elsevier Patient Education  2020 [employer].

## 2019-10-12 ENCOUNTER — Inpatient Hospital Stay (HOSPITAL_COMMUNITY)
Admission: AD | Admit: 2019-10-12 | Discharge: 2019-10-14 | DRG: 805 | Disposition: A | Discharge status: Home / Self Care | Payer: Medicaid Other | Attending: Family Medicine | Admitting: Family Medicine

## 2019-10-12 ENCOUNTER — Other Ambulatory Visit: Payer: Self-pay

## 2019-10-12 ENCOUNTER — Inpatient Hospital Stay (HOSPITAL_COMMUNITY): Payer: Medicaid Other | Admitting: Anesthesiology

## 2019-10-12 ENCOUNTER — Encounter (HOSPITAL_COMMUNITY): Payer: Self-pay | Admitting: Obstetrics & Gynecology

## 2019-10-12 DIAGNOSIS — O4290 Premature rupture of membranes, unspecified as to length of time between rupture and onset of labor, unspecified weeks of gestation: Secondary | ICD-10-CM

## 2019-10-12 DIAGNOSIS — O480 Post-term pregnancy: Secondary | ICD-10-CM | POA: Diagnosis present

## 2019-10-12 DIAGNOSIS — Z20822 Contact with and (suspected) exposure to covid-19: Secondary | ICD-10-CM | POA: Diagnosis present

## 2019-10-12 DIAGNOSIS — O4202 Full-term premature rupture of membranes, onset of labor within 24 hours of rupture: Secondary | ICD-10-CM

## 2019-10-12 DIAGNOSIS — B951 Streptococcus, group B, as the cause of diseases classified elsewhere: Secondary | ICD-10-CM | POA: Diagnosis present

## 2019-10-12 DIAGNOSIS — Z3A40 40 weeks gestation of pregnancy: Secondary | ICD-10-CM | POA: Diagnosis not present

## 2019-10-12 DIAGNOSIS — O4292 Full-term premature rupture of membranes, unspecified as to length of time between rupture and onset of labor: Secondary | ICD-10-CM | POA: Diagnosis present

## 2019-10-12 DIAGNOSIS — O9882 Other maternal infectious and parasitic diseases complicating childbirth: Secondary | ICD-10-CM | POA: Diagnosis present

## 2019-10-12 DIAGNOSIS — B9689 Other specified bacterial agents as the cause of diseases classified elsewhere: Secondary | ICD-10-CM | POA: Diagnosis present

## 2019-10-12 DIAGNOSIS — O99824 Streptococcus B carrier state complicating childbirth: Secondary | ICD-10-CM | POA: Diagnosis not present

## 2019-10-12 DIAGNOSIS — O9902 Anemia complicating childbirth: Secondary | ICD-10-CM | POA: Diagnosis not present

## 2019-10-12 DIAGNOSIS — B373 Candidiasis of vulva and vagina: Secondary | ICD-10-CM | POA: Diagnosis present

## 2019-10-12 DIAGNOSIS — O99019 Anemia complicating pregnancy, unspecified trimester: Secondary | ICD-10-CM | POA: Diagnosis present

## 2019-10-12 DIAGNOSIS — D649 Anemia, unspecified: Secondary | ICD-10-CM | POA: Diagnosis not present

## 2019-10-12 HISTORY — DX: Anemia, unspecified: D64.9

## 2019-10-12 LAB — CBC
HCT: 34.3 % — ABNORMAL LOW (ref 36.0–46.0)
Hemoglobin: 10.4 g/dL — ABNORMAL LOW (ref 12.0–15.0)
MCH: 23 pg — ABNORMAL LOW (ref 26.0–34.0)
MCHC: 30.3 g/dL (ref 30.0–36.0)
MCV: 75.7 fL — ABNORMAL LOW (ref 80.0–100.0)
Platelets: 189 10*3/uL (ref 150–400)
RBC: 4.53 MIL/uL (ref 3.87–5.11)
RDW: 17.9 % — ABNORMAL HIGH (ref 11.5–15.5)
WBC: 7.8 10*3/uL (ref 4.0–10.5)
nRBC: 0 % (ref 0.0–0.2)

## 2019-10-12 LAB — COMPREHENSIVE METABOLIC PANEL
ALT: 21 U/L (ref 0–44)
AST: 25 U/L (ref 15–41)
Albumin: 2.9 g/dL — ABNORMAL LOW (ref 3.5–5.0)
Alkaline Phosphatase: 95 U/L (ref 38–126)
Anion gap: 14 (ref 5–15)
BUN: 5 mg/dL — ABNORMAL LOW (ref 6–20)
CO2: 19 mmol/L — ABNORMAL LOW (ref 22–32)
Calcium: 8.7 mg/dL — ABNORMAL LOW (ref 8.9–10.3)
Chloride: 105 mmol/L (ref 98–111)
Creatinine, Ser: 0.39 mg/dL — ABNORMAL LOW (ref 0.44–1.00)
GFR calc Af Amer: >60 mL/min (ref >60)
GFR calc non Af Amer: >60 mL/min (ref >60)
Glucose, Bld: 87 mg/dL (ref 70–99)
Potassium: 3.6 mmol/L (ref 3.5–5.1)
Sodium: 138 mmol/L (ref 135–145)
Total Bilirubin: 0.4 mg/dL (ref 0.3–1.2)
Total Protein: 6.5 g/dL (ref 6.5–8.1)

## 2019-10-12 LAB — RESPIRATORY PANEL BY RT PCR (FLU A&B, COVID)
Influenza A by PCR: NEGATIVE
Influenza B by PCR: NEGATIVE
SARS Coronavirus 2 by RT PCR: NEGATIVE

## 2019-10-12 LAB — TYPE AND SCREEN
ABO/RH(D): A POS
Antibody Screen: NEGATIVE

## 2019-10-12 LAB — ABO/RH: ABO/RH(D): A POS

## 2019-10-12 LAB — POCT FERN TEST: POCT Fern Test: POSITIVE

## 2019-10-12 MED ORDER — SODIUM CHLORIDE 0.9 % IV SOLN
5.0000 10*6.[IU] | Freq: Once | INTRAVENOUS | Status: AC
  Administered 2019-10-12: 5 10*6.[IU] via INTRAVENOUS
  Filled 2019-10-12: qty 5

## 2019-10-12 MED ORDER — ACETAMINOPHEN 325 MG PO TABS: 650.0000 mg | ORAL_TABLET | ORAL | Status: DC | PRN

## 2019-10-12 MED ORDER — TERBUTALINE SULFATE 1 MG/ML IJ SOLN: 0.2500 mg | Freq: Once | INTRAMUSCULAR | Status: DC | PRN

## 2019-10-12 MED ORDER — ACETAMINOPHEN 325 MG PO TABS
650.0000 mg | ORAL_TABLET | ORAL | Status: DC | PRN
  Administered 2019-10-13: 650 mg via ORAL
  Filled 2019-10-12: qty 2

## 2019-10-12 MED ORDER — SOD CITRATE-CITRIC ACID 500-334 MG/5ML PO SOLN: 30.0000 mL | ORAL | Status: DC | PRN

## 2019-10-12 MED ORDER — PENICILLIN G POT IN DEXTROSE 60000 UNIT/ML IV SOLN
3.0000 10*6.[IU] | INTRAVENOUS | Status: DC
  Administered 2019-10-12 (×2): 3 10*6.[IU] via INTRAVENOUS
  Filled 2019-10-12 (×2): qty 50

## 2019-10-12 MED ORDER — LIDOCAINE HCL (PF) 1 % IJ SOLN
INTRAMUSCULAR | Status: DC | PRN
  Administered 2019-10-12 (×2): 5 mL via EPIDURAL

## 2019-10-12 MED ORDER — FLEET ENEMA 7-19 GM/118ML RE ENEM: 1.0000 | ENEMA | Freq: Every day | RECTAL | Status: DC | PRN

## 2019-10-12 MED ORDER — PHENYLEPHRINE 40 MCG/ML (10ML) SYRINGE FOR IV PUSH (FOR BLOOD PRESSURE SUPPORT)
80.0000 ug | PREFILLED_SYRINGE | INTRAVENOUS | Status: DC | PRN
  Administered 2019-10-12: 80 ug via INTRAVENOUS

## 2019-10-12 MED ORDER — ONDANSETRON HCL 4 MG/2ML IJ SOLN: 4.0000 mg | Freq: Four times a day (QID) | INTRAMUSCULAR | Status: DC | PRN

## 2019-10-12 MED ORDER — FENTANYL-BUPIVACAINE-NACL 0.5-0.125-0.9 MG/250ML-% EP SOLN
12.0000 mL/h | EPIDURAL | Status: DC | PRN
  Filled 2019-10-12 (×2): qty 250

## 2019-10-12 MED ORDER — FENTANYL CITRATE (PF) 100 MCG/2ML IJ SOLN: 50.0000 ug | INTRAMUSCULAR | Status: DC | PRN

## 2019-10-12 MED ORDER — DIPHENHYDRAMINE HCL 25 MG PO CAPS: 25.0000 mg | ORAL_CAPSULE | Freq: Four times a day (QID) | ORAL | Status: DC | PRN

## 2019-10-12 MED ORDER — COCONUT OIL OIL: 1.0000 "application " | TOPICAL_OIL | Status: DC | PRN

## 2019-10-12 MED ORDER — ONDANSETRON HCL 4 MG PO TABS: 4.0000 mg | ORAL_TABLET | ORAL | Status: DC | PRN

## 2019-10-12 MED ORDER — LACTATED RINGERS IV SOLN: 500.0000 mL | Freq: Once | INTRAVENOUS | Status: DC

## 2019-10-12 MED ORDER — OXYTOCIN 40 UNITS IN NORMAL SALINE INFUSION - SIMPLE MED
2.5000 [IU]/h | INTRAVENOUS | Status: DC
  Filled 2019-10-12: qty 1000

## 2019-10-12 MED ORDER — IBUPROFEN 600 MG PO TABS
600.0000 mg | ORAL_TABLET | Freq: Four times a day (QID) | ORAL | Status: DC
  Administered 2019-10-13 – 2019-10-14 (×7): 600 mg via ORAL
  Filled 2019-10-12 (×7): qty 1

## 2019-10-12 MED ORDER — LACTATED RINGERS IV SOLN: 500.0000 mL | INTRAVENOUS | Status: DC | PRN

## 2019-10-12 MED ORDER — DIPHENHYDRAMINE HCL 50 MG/ML IJ SOLN: 12.5000 mg | INTRAMUSCULAR | Status: DC | PRN

## 2019-10-12 MED ORDER — OXYTOCIN 40 UNITS IN NORMAL SALINE INFUSION - SIMPLE MED
1.0000 m[IU]/min | INTRAVENOUS | Status: DC
  Administered 2019-10-12: 2 m[IU]/min via INTRAVENOUS

## 2019-10-12 MED ORDER — SENNOSIDES-DOCUSATE SODIUM 8.6-50 MG PO TABS
2.0000 | ORAL_TABLET | ORAL | Status: DC
  Administered 2019-10-13 (×2): 2 via ORAL
  Filled 2019-10-12 (×2): qty 2

## 2019-10-12 MED ORDER — PRENATAL MULTIVITAMIN CH: 1.0000 | ORAL_TABLET | Freq: Every day | ORAL | Status: DC

## 2019-10-12 MED ORDER — EPHEDRINE 5 MG/ML INJ: 10.0000 mg | INTRAVENOUS | Status: DC | PRN

## 2019-10-12 MED ORDER — ZOLPIDEM TARTRATE 5 MG PO TABS: 5.0000 mg | ORAL_TABLET | Freq: Every evening | ORAL | Status: DC | PRN

## 2019-10-12 MED ORDER — PHENYLEPHRINE 40 MCG/ML (10ML) SYRINGE FOR IV PUSH (FOR BLOOD PRESSURE SUPPORT)
80.0000 ug | PREFILLED_SYRINGE | INTRAVENOUS | Status: DC | PRN
  Filled 2019-10-12: qty 10

## 2019-10-12 MED ORDER — HYDROXYZINE HCL 50 MG PO TABS: 50.0000 mg | ORAL_TABLET | Freq: Four times a day (QID) | ORAL | Status: DC | PRN

## 2019-10-12 MED ORDER — OXYTOCIN BOLUS FROM INFUSION
500.0000 mL | Freq: Once | INTRAVENOUS | Status: AC
  Administered 2019-10-12: 500 mL via INTRAVENOUS

## 2019-10-12 MED ORDER — SIMETHICONE 80 MG PO CHEW: 80.0000 mg | CHEWABLE_TABLET | ORAL | Status: DC | PRN

## 2019-10-12 MED ORDER — ONDANSETRON HCL 4 MG/2ML IJ SOLN: 4.0000 mg | INTRAMUSCULAR | Status: DC | PRN

## 2019-10-12 MED ORDER — TETANUS-DIPHTH-ACELL PERTUSSIS 5-2.5-18.5 LF-MCG/0.5 IM SUSP: 0.5000 mL | Freq: Once | INTRAMUSCULAR | Status: DC

## 2019-10-12 MED ORDER — WITCH HAZEL-GLYCERIN EX PADS: 1.0000 "application " | MEDICATED_PAD | CUTANEOUS | Status: DC | PRN

## 2019-10-12 MED ORDER — DIBUCAINE (PERIANAL) 1 % EX OINT: 1.0000 "application " | TOPICAL_OINTMENT | CUTANEOUS | Status: DC | PRN

## 2019-10-12 MED ORDER — LIDOCAINE HCL (PF) 1 % IJ SOLN: 30.0000 mL | INTRAMUSCULAR | Status: DC | PRN

## 2019-10-12 MED ORDER — PRENATAL MULTIVITAMIN CH
1.0000 | ORAL_TABLET | Freq: Every day | ORAL | Status: DC
  Administered 2019-10-13 – 2019-10-14 (×2): 1 via ORAL
  Filled 2019-10-12 (×2): qty 1

## 2019-10-12 MED ORDER — OXYCODONE-ACETAMINOPHEN 5-325 MG PO TABS: 2.0000 | ORAL_TABLET | ORAL | Status: DC | PRN

## 2019-10-12 MED ORDER — OXYCODONE-ACETAMINOPHEN 5-325 MG PO TABS: 1.0000 | ORAL_TABLET | ORAL | Status: DC | PRN

## 2019-10-12 MED ORDER — BENZOCAINE-MENTHOL 20-0.5 % EX AERO: 1.0000 "application " | INHALATION_SPRAY | CUTANEOUS | Status: DC | PRN

## 2019-10-12 MED ORDER — SODIUM CHLORIDE (PF) 0.9 % IJ SOLN
INTRAMUSCULAR | Status: DC | PRN
  Administered 2019-10-12: 12 mL/h via EPIDURAL

## 2019-10-12 MED ORDER — MISOPROSTOL 25 MCG QUARTER TABLET: 25.0000 ug | ORAL_TABLET | ORAL | Status: DC | PRN

## 2019-10-12 MED ORDER — LACTATED RINGERS IV SOLN: INTRAVENOUS | Status: DC

## 2019-10-12 NOTE — Progress Notes (Signed)
Christine Hodge is a 29 y.o. [phone] at [redacted]w[redacted]d admitted for rupture of membranes  Subjective: Comfortable with epidural  Objective: BP (!) 107/57   Pulse 93   Temp (!) 97 F (36.1 C) (Oral)   Resp 20   Ht 5\' 8"  (1.727 m)   Wt 94.7 kg   LMP 11/05/2018 (Approximate)   SpO2 100%   BMI 31.73 kg/m  No intake/output data recorded.  FHT:  FHR: 120 bpm, variability: moderate,  accelerations:  Present,  decelerations:  Absent UC:   regular, every 3-4 minutes  SVE:   Dilation: 6.5 Effacement (%): 70 Station: -2 Exam by:: dr [email or web address]  Labs: Lab Results  Component Value Date   WBC 7.8 10/12/2019   HGB 10.4 (L) 10/12/2019   HCT 34.3 (L) 10/12/2019   MCV 75.7 (L) 10/12/2019   PLT 189 10/12/2019    Assessment / Plan: Christine Hodge is a 29 y.o. G4P3003 at [redacted]w[redacted]d here for PROM  Labor: PROM. Cervix unchanged from several hours ago, start pitocin at this time, consider IUPC if no cervical change at next exam Fetal Wellbeing:  Category I Pain Control:  Epidural I/D:  GBS postivie, s/p 2 doses PCN Anticipated MOD:  NSVD  Ryan Palermo L Lillia Lengel DO OB Fellow, Faculty Practice 10/12/2019, 5:45 PM

## 2019-10-12 NOTE — MAU Note (Signed)
Christine Hodge is a 29 y.o. at [redacted]w[redacted]d here in MAU reporting: around 0600 had a big gush of fluid. States still leaking. Fluid is clear. No contractions.   Onset of complaint: today  Pain score: 0/10  Vitals:   10/12/19 0915  BP: 106/61  Pulse: 92  Resp: 16  Temp: 98.5 F (36.9 C)  SpO2: 100%     FHT: +FM  Lab orders placed from triage: none

## 2019-10-12 NOTE — Anesthesia Preprocedure Evaluation (Signed)
Anesthesia Evaluation  Patient identified by MRN, date of birth, ID band Patient awake    Reviewed: Allergy & Precautions, H&P , NPO status , Patient's Chart, lab work & pertinent test results  Airway Mallampati: II   Neck ROM: full    Dental   Pulmonary    breath sounds clear to auscultation       Cardiovascular negative cardio ROS   Rhythm:regular Rate:Normal     Neuro/Psych  Headaches, PSYCHIATRIC DISORDERS Depression    GI/Hepatic   Endo/Other    Renal/GU      Musculoskeletal   Abdominal   Peds  Hematology  (+) Blood dyscrasia, anemia ,   Anesthesia Other Findings   Reproductive/Obstetrics (+) Pregnancy                             Anesthesia Physical Anesthesia Plan  ASA: II  Anesthesia Plan: Epidural   Post-op Pain Management:    Induction: Intravenous  PONV Risk Score and Plan: 2 and Treatment may vary due to age or medical condition  Airway Management Planned: Natural Airway  Additional Equipment:   Intra-op Plan:   Post-operative Plan:   Informed Consent: I have reviewed the patients History and Physical, chart, labs and discussed the procedure including the risks, benefits and alternatives for the proposed anesthesia with the patient or authorized representative who has indicated his/her understanding and acceptance.       Plan Discussed with: Anesthesiologist  Anesthesia Plan Comments:         Anesthesia Quick Evaluation

## 2019-10-12 NOTE — H&P (Signed)
OBSTETRIC ADMISSION HISTORY AND PHYSICAL  Christine Hodge is a 29 y.o. female [phone] with IUP at [redacted]w[redacted]d presenting for PROM, with SROM at 0600. She reports +FMs. No LOF, VB, blurry vision, headaches, peripheral edema, or RUQ pain. She plans on breast and bottlefeeding. She requests Nexplanon for birth control.  Dating: By 14 week [place] --->  Estimated Date of Delivery: 10/08/19  Sono:   @[redacted]w[redacted]d , normal anatomy, cephalic presentation, 2202g, 10-12-1978, EFW 4#14 - ductal arch not well visualized  Prenatal History/Complications: GBS UTI H/o Chlamydia- TOC negative  Past Medical History: Past Medical History:  Diagnosis Date  . Anemia   . Headache   . Medical history non-contributory     Past Surgical History: Past Surgical History:  Procedure Laterality Date  . NO PAST SURGERIES      Obstetrical History: OB History    Gravida  4   Para  3   Term  3   Preterm  0   AB  0   Living  3     SAB  0   TAB  0   Ectopic  0   Multiple  0   Live Births  3           Social History: Social History   Socioeconomic History  . Marital status: Significant Other    Spouse name: Not on file  . Number of children: 3  . Years of education: Not on file  . Highest education level: Not on file  Occupational History  . Occupation: [employer]  Tobacco Use  . Smoking status: Never Smoker  . Smokeless tobacco: Never Used  Substance and Sexual Activity  . Alcohol use: Not Currently    Alcohol/week: 0.0 standard drinks  . Drug use: No  . Sexual activity: Yes  Other Topics Concern  . Not on file  Social History Narrative   Immigrant Social History:   - Date arrived in [place]: Sept 2, 2015   - Language: Speaks English. Also speaks 12-29-1982 and [place].   - Education: Highest level of education: no formal education   - Tobacco/alcohol/drug use: denies   - Marriage Status: married   - Sexual activity: vaginal   - Class A/B conditions: none   - denies history of torture.     Social Determinants of Health   Financial Resource Strain:   . Difficulty of Paying Living Expenses:   Food Insecurity:   . Worried About [place] in the Last Year:   . Programme researcher, broadcasting/film/video in the Last Year:   Transportation Needs:   . Barista (Medical):   Freight forwarder Lack of Transportation (Non-Medical):   Physical Activity:   . Days of Exercise per Week:   . Minutes of Exercise per Session:   Stress:   . Feeling of Stress :   Social Connections:   . Frequency of Communication with Friends and Family:   . Frequency of Social Gatherings with Friends and Family:   . Attends Religious Services:   . Active Member of Clubs or Organizations:   . Attends [employer] Meetings:   Banker Marital Status:     Family History: Family History  Problem Relation Age of Onset  . Depression Mother   . Hypertension Mother   . Diabetes Mother     Allergies: No Known Allergies  Facility-Administered Medications Prior to Admission  Medication Dose Route Frequency Provider Last Rate Last Admin  . medroxyPROGESTERone (DEPO-PROVERA) injection 150 mg  150 mg Intramuscular Q90 days Kandis Cocking A, CNM   150 mg at 11/09/17 1203   Medications Prior to Admission  Medication Sig Dispense Refill Last Dose  . ferrous gluconate (FERGON) 324 MG tablet Take 1 tablet (324 mg total) by mouth daily with breakfast. 90 tablet 3   . ibuprofen (ADVIL,MOTRIN) 200 MG tablet Take 400 mg by mouth every 6 (six) hours as needed for moderate pain.     . Multiple Vitamin (MULTIVITAMIN WITH MINERALS) TABS tablet Take 1 tablet by mouth daily.     . Prenatal Vit-Fe Fumarate-FA (PREPLUS) 27-1 MG TABS Take 1 tablet by mouth daily. 90 tablet 3      Review of Systems:  All systems reviewed and negative except as stated in HPI  PE: Blood pressure 115/66, pulse 98, temperature 98.6 F (37 C), temperature source Oral, resp. rate 16, height 5\' 8"  (1.727 m), weight 94.7 kg, last menstrual period  11/05/2018, SpO2 100 %, currently breastfeeding. General appearance: alert, cooperative and appears stated age Lungs: regular rate and effort Heart: regular rate  Abdomen: soft, non-tender Extremities: Homans sign is negative, no sign of DVT Presentation: cephalic by BSUS in MAU EFM: 135 bpm, moderate variability, 15x15 accels, no decels Toco: irregular, >10 min apart  Prenatal labs: ABO, Rh: A/Positive/-- (12/28 1520) Antibody: Negative (12/28 1520) Rubella: 6.32 (12/28 1520) RPR: Non Reactive (02/11 1007)  HBsAg: Negative (12/28 1520)  HIV: Non Reactive (02/11 1007)  GBS:   positive in urine 2 hr GTT normal  Prenatal Transfer Tool  Maternal Diabetes: No Genetic Screening: Normal Maternal Ultrasounds/Referrals: Normal Fetal Ultrasounds or other Referrals:  Referred to Materal Fetal Medicine  Maternal Substance Abuse:  No Significant Maternal Medications:  None Significant Maternal Lab Results: Group B Strep positive  Results for orders placed or performed during the hospital encounter of 10/12/19 (from the past 24 hour(s))  Fern Test   Collection Time: 10/12/19  9:35 AM  Result Value Ref Range   POCT Fern Test Positive = ruptured amniotic membanes   Respiratory Panel by RT PCR (Flu A&B, Covid) - Nasopharyngeal Swab   Collection Time: 10/12/19  9:58 AM   Specimen: Nasopharyngeal Swab  Result Value Ref Range   SARS Coronavirus 2 by RT PCR NEGATIVE NEGATIVE   Influenza A by PCR NEGATIVE NEGATIVE   Influenza B by PCR NEGATIVE NEGATIVE  CBC   Collection Time: 10/12/19 10:17 AM  Result Value Ref Range   WBC 7.8 4.0 - 10.5 K/uL   RBC 4.53 3.87 - 5.11 MIL/uL   Hemoglobin 10.4 (L) 12.0 - 15.0 g/dL   HCT 34.3 (L) 36.0 - 46.0 %   MCV 75.7 (L) 80.0 - 100.0 fL   MCH 23.0 (L) 26.0 - 34.0 pg   MCHC 30.3 30.0 - 36.0 g/dL   RDW 17.9 (H) 11.5 - 15.5 %   Platelets 189 150 - 400 K/uL   nRBC 0.0 0.0 - 0.2 %    Patient Active Problem List   Diagnosis Date Noted  . PROM (premature  rupture of membranes) 10/12/2019  . Post term pregnancy over 40 weeks 10/12/2019  . Anemia, antepartum 07/28/2019  . GBS (group b Streptococcus) UTI complicating pregnancy, second trimester 06/09/2019  . Chlamydia trachomatis infection in mother during second trimester of pregnancy 06/04/2019  . Supervision of normal pregnancy, antepartum 06/02/2019  . Depression 07/09/2018  . 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 Hypopigmentation 07/30/2015    Assessment: Henriette Hesser is a 29  y.o. [id number] at [redacted]w[redacted]d here for PROM  1. Labor: expectant management until adequately prophylaxed. Consider cytotec/FB/Pitocin as appropriate 2. FWB: Cat I 3. Pain: per patient request 4. GBS: positive UTI, will treat with PCN   Plan: Admit to L&D  Anticipate vaginal delivery  Tyrail Grandfield L Chancie Lampert, DO  10/12/2019, 11:37 AM

## 2019-10-12 NOTE — Discharge Summary (Addendum)
Postpartum Discharge Summary     Patient Name: Christine Hodge DOB: 10/05/1990 MRN: [id number]  Date of admission: 10/12/2019 Delivering Provider: Alroy Bailiff   Date of discharge: 10/14/2019  Admitting diagnosis: Post term pregnancy over 40 weeks [O48.0] Intrauterine pregnancy: [redacted]w[redacted]d    Secondary diagnosis:  Principal Problem:   PROM (premature rupture of membranes) Active Problems:   GBS (group b Streptococcus) UTI complicating pregnancy, second trimester   Anemia, antepartum   Post term pregnancy over 40 weeks   [redacted] weeks gestation of pregnancy  Additional problems: None     Discharge diagnosis: Term Pregnancy Delivered                                                                                                Post partum procedures:None  Augmentation: Pitocin  Complications: None  Hospital course:  Onset of Labor With Vaginal Delivery     29y.o. yo [id number] admitted in Active Labor on 10/12/2019. Patient had an uncomplicated labor course as follows: Initial SVE 6/60/-2. SROM performed. Pitocin eventually started. Progressed to complete with uncomplicated delivery.  Membrane Rupture Time/Date: 6:00 AM ,10/12/2019   Intrapartum Procedures: Episiotomy: None [1]                                         Lacerations:  None [1]  Patient had a delivery of a Viable infant. 10/12/2019  Information for the patient's newborn:  FeMercedies, Ganesh0[[id number]]Delivery Method: Vag-Spont     Pateint had an uncomplicated postpartum course. Patient diagnosed with BV and sent with course of Flagyl. She is ambulating, tolerating a regular diet, passing flatus, and urinating well. Patient is discharged home in stable condition on 10/14/19.  Delivery time: 8:51 PM    Magnesium Sulfate received: No BMZ received: No Rhophylac:No MMR:No Transfusion:No  Physical exam  Vitals:   10/13/19 0800 10/13/19 1524 10/13/19 2143 10/14/19 0530  BP: 100/77 111/61 117/76  125/84  Pulse: 85 90 85 69  Resp: _0 Temp: 97.7 F (36.5 C) 97.6 F (36.4 C) 98.1 F (36.7 C) 97.9 F (36.6 C)  TempSrc: Oral Axillary Axillary Oral  SpO2: 100%  100%   Weight:      Height:       General: alert, cooperative and no distress Lochia: appropriate Uterine Fundus: firm Incision: N/A DVT Evaluation: No evidence of DVT seen on physical exam. Labs: Lab Results  Component Value Date   WBC 7.8 10/12/2019   HGB 10.4 (L) 10/12/2019   HCT 34.3 (L) 10/12/2019   MCV 75.7 (L) 10/12/2019   PLT 189 10/12/2019   CMP Latest Ref Rng & Units 10/12/2019  Glucose 70 - 99 mg/dL 87  BUN 6 - 20 mg/dL 5(L)  Creatinine 0.44 - 1.00 mg/dL 0.39(L)  Sodium 135 - 145 mmol/L 138  Potassium 3.5 - 5.1 mmol/L 3.6  Chloride 98 - 111 mmol/L 105  CO2 22 - 32 mmol/L 19(L)  Calcium 8.9 -  10.3 mg/dL 8.7(L)  Total Protein 6.5 - 8.1 g/dL 6.5  Total Bilirubin 0.3 - 1.2 mg/dL 0.4  Alkaline Phos 38 - 126 U/L 95  AST 15 - 41 U/L 25  ALT 0 - 44 U/L 21   Edinburgh Score: Edinburgh Postnatal Depression Scale Screening Tool 10/13/2019  I have been able to laugh and see the funny side of things. 0  I have looked forward with enjoyment to things. 0  I have blamed myself unnecessarily when things went wrong. 1  I have been anxious or worried for no good reason. 1  I have felt scared or panicky for no good reason. 0  Things have been getting on top of me. 2  I have been so unhappy that I have had difficulty sleeping. 1  I have felt sad or miserable. 0  I have been so unhappy that I have been crying. 1  The thought of harming myself has occurred to me. 1  Edinburgh Postnatal Depression Scale Total 7    Discharge instruction: per After Visit Summary and "Baby and Me Booklet".  After visit meds:  Allergies as of 10/14/2019   No Known Allergies     Medication List    STOP taking these medications   ferrous gluconate 324 MG tablet Commonly known as: FERGON   ibuprofen 200 MG  tablet Commonly known as: ADVIL   multivitamin with minerals Tabs tablet     TAKE these medications   metroNIDAZOLE 500 MG tablet Commonly known as: FLAGYL Take 1 tablet (500 mg total) by mouth 2 (two) times daily for 6 days.   PrePLUS 27-1 MG Tabs Take 1 tablet by mouth daily.       Diet: routine diet  Activity: Advance as tolerated. Pelvic rest for 6 weeks.   Outpatient follow up:4 weeks Follow up Appt: Future Appointments  Date Time Provider [place]  11/13/2019  1:30 PM Truett Mainland, DO [facility] None   Follow up Visit: [place] Follow up.   Contact information: [address] [place] [address] [phone]            Please schedule this patient for Postpartum visit in: 4 weeks with the following provider: Any provider In-Person For C/S patients schedule nurse incision check in weeks 2 weeks: no Low risk pregnancy complicated by: none Delivery mode:  SVD Anticipated Birth Control:  Nexplanon PP Procedures needed: Nexplanon placement  Schedule Integrated [place] visit: no     Newborn Data: Live born female  Birth Weight: 4080g  APGAR: 73, 9   Newborn Delivery   Birth date/time: 10/12/2019 20:51:00 Delivery type: Vaginal, Spontaneous      Baby Feeding: Bottle and Breast Disposition:home with mother   10/14/2019 Chauncey Mann, MD

## 2019-10-12 NOTE — Anesthesia Procedure Notes (Signed)
Epidural Patient location during procedure: OB Start time: 10/12/2019 2:09 PM End time: 10/12/2019 2:20 PM  Staffing Anesthesiologist: Achille Rich, MD Performed: anesthesiologist   Preanesthetic Checklist Completed: patient identified, IV checked, site marked, risks and benefits discussed, monitors and equipment checked, pre-op evaluation and timeout performed  Epidural Patient position: sitting Prep: DuraPrep Patient monitoring: heart rate, cardiac monitor, continuous pulse ox and blood pressure Approach: midline Location: L2-L3 Injection technique: LOR saline  Needle:  Needle type: Tuohy  Needle gauge: 17 G Needle length: 9 cm Needle insertion depth: 5 cm Catheter type: closed end flexible Catheter size: 19 Gauge Catheter at skin depth: 11 cm Test dose: negative and Other  Assessment Events: blood not aspirated, injection not painful, no injection resistance and negative IV test  Additional Notes Informed consent obtained prior to proceeding including risk of failure, 1% risk of PDPH, risk of minor discomfort and bruising.  Discussed rare but serious complications including epidural abscess, permanent nerve injury, epidural hematoma.  Discussed alternatives to epidural analgesia and patient desires to proceed.  Timeout performed pre-procedure verifying patient name, procedure, and platelet count.  Patient tolerated procedure well. Reason for block:procedure for pain

## 2019-10-13 ENCOUNTER — Other Ambulatory Visit (HOSPITAL_COMMUNITY): Payer: Medicaid Other

## 2019-10-13 ENCOUNTER — Encounter (HOSPITAL_COMMUNITY): Payer: Self-pay | Admitting: Obstetrics & Gynecology

## 2019-10-13 LAB — CERVICOVAGINAL ANCILLARY ONLY
Bacterial Vaginitis (gardnerella): POSITIVE — AB
Candida Glabrata: NEGATIVE
Candida Vaginitis: POSITIVE — AB
Comment: NEGATIVE
Comment: NEGATIVE
Comment: NEGATIVE
Comment: NEGATIVE
Trichomonas: NEGATIVE

## 2019-10-13 LAB — RPR: RPR Ser Ql: NONREACTIVE

## 2019-10-13 MED ORDER — METRONIDAZOLE 500 MG PO TABS
500.0000 mg | ORAL_TABLET | Freq: Two times a day (BID) | ORAL | Status: DC
  Administered 2019-10-13 – 2019-10-14 (×3): 500 mg via ORAL
  Filled 2019-10-13 (×4): qty 1

## 2019-10-13 MED ORDER — FLUCONAZOLE 150 MG PO TABS
150.0000 mg | ORAL_TABLET | Freq: Once | ORAL | Status: AC
  Administered 2019-10-13: 150 mg via ORAL
  Filled 2019-10-13: qty 1

## 2019-10-13 NOTE — Progress Notes (Signed)
Post Partum Day 1 Subjective: Patient reports feeling well. She is tolerating PO. Lochia minimal. Ambulating some and has urinated.  Objective: Blood pressure 109/65, pulse 92, temperature 98.5 F (36.9 C), temperature source Oral, resp. rate 18, height 5\' 8"  (1.727 m), weight 94.7 kg, last menstrual period 11/05/2018, SpO2 100 %, currently breastfeeding.  Physical Exam:  General: alert, cooperative and appears stated age Lochia: appropriate Uterine Fundus: firm Incision: NA DVT Evaluation: No evidence of DVT seen on physical exam.  Recent Labs    10/12/19 1017  HGB 10.4*  HCT 34.3*    Assessment/Plan: Plan for discharge tomorrow  Vitals stable Breast and bottle feeding Desires Nexplanon outpatient    LOS: 1 day   Shanan Mcmiller N Topaz Raglin 10/13/2019, 4:37 AM

## 2019-10-13 NOTE — Lactation Note (Signed)
This note was copied from a baby's chart. Lactation Consultation Note  Patient Name: Christine Hodge [id number] Date: 10/13/2019 Reason for consult: Follow-up assessment  Follow-up assessment of 20-hours baby Christine, breastfeed exclusively with 2.82% weight lost. Mother is P4.  MOB is holding baby upon arrival. Baby seems content and relaxed. Mother states she just finished feeding baby for 30 min.  Mother reports no nipple discomfort and good feeding with audible swallows. Reviewed with parents signs that baby is getting enough breast milk like wet/dirty diapers and normal newborn behavior.   Mother shared she is going back to work soon and she is planning to supplement with formula as needed. Mother also shared she has had a plentiful milk supply in the past. LC offered manual pump and explained to mom benefits of continuing to offer her breast milk. Mother is aware she can get a DEBP from her [facility] office, too. Reviewed manual pump set up, frequency and cleaning as well as milk storage.   Parents verbalized understanding and all questions/concerned addressed at this time. Parents are aware to call lactation as needed.     Maternal Data Does the patient have breastfeeding experience prior to this delivery?: Yes  Feeding Feeding Type: Breast Fed Interventions Interventions: Hand pump  Lactation Tools Discussed/Used WIC Program: Yes Pump Review: Milk Storage;Setup, frequency, and cleaning Initiated by:: Tersea Aulds Higuera IBCLC Date initiated:: 10/13/19   Consult Status Consult Status: Follow-up Date: 10/14/19 Follow-up type: In-patient    Ryanna Teschner A Higuera Ancidey 10/13/2019, 5:21 PM

## 2019-10-13 NOTE — Anesthesia Postprocedure Evaluation (Signed)
Anesthesia Post Note  Patient: Associate Professor  Procedure(s) Performed: AN AD HOC LABOR EPIDURAL     Patient location during evaluation: Mother Baby Anesthesia Type: Epidural Level of consciousness: awake and alert Pain management: pain level controlled Vital Signs Assessment: post-procedure vital signs reviewed and stable Respiratory status: spontaneous breathing, nonlabored ventilation and respiratory function stable Cardiovascular status: stable Postop Assessment: no headache, no backache and epidural receding Anesthetic complications: no    Last Vitals:  Vitals:   10/13/19 0410 10/13/19 0500  BP: (!) 95/51 94/74  Pulse: 75   Resp: 16   Temp: 36.5 C   SpO2: 100%     Last Pain:  Vitals:   10/13/19 0410  TempSrc: Oral  PainSc: 5    Pain Goal:                   Christine Hodge

## 2019-10-13 NOTE — Progress Notes (Signed)
CSW received consult for hx of Depression and score of 7 on Edinburgh with answering 1 to question 10.  CSW met with MOB to offer support and complete assessment.    CSW congratulated MOB on the birth of infant. CSW advised MOB of CSW's role and the reason for CSW coming to visit with her. MOB reported that she was diagnosed with depression in 2019 after the birth of her last daughter. MOB reported that she was never placed on medications but did engage in some therapy. MOB reported that she would also search things online as they related to depression. MOB reports that during her pregnancy depression was very low as "I feel stable and don't need anything right now". CSW understanding and inquired from MOB on her answering 1 to question #10. MOB reported that this was a mistake and that she hasn't been feeling SI or HI and denies DV. MOB reported that she has all needed items to care for infant with no other needs.   CSW provided education regarding the baby blues period vs. perinatal mood disorders, discussed treatment and gave resources for mental health follow up if concerns arise.  CSW recommends self-evaluation during the postpartum time period using the New Mom Checklist from Postpartum Progress and encouraged MOB to contact a medical professional if symptoms are noted at any time.   CSW provided review of Sudden Infant Death Syndrome (SIDS) precautions.   CSW identifies no further need for intervention and no barriers to discharge at this time.  Mendi Constable S. Tashena Ibach, MSW, LCSW [facility] [phone]   

## 2019-10-13 NOTE — Lactation Note (Signed)
This note was copied from a baby's chart. Lactation Consultation Note Baby 6 hrs old. Mom BF in side lying position. Mom denied need of interpreter. Mom stated BF going well. Mom BF her 1st child 13 months, 2nd child 15 months, and 3rd child for 6 months. Encouraged STS. Baby had good body alignment and positioning.   Mom stated she didn't have any questions about BF at this time. Encouraged mom to call for assistance or questions. Mom stated she didn't need any assistance at this time. Lactation brochure given.  Patient Name: Christine Hodge [id number] Date: 10/13/2019 Reason for consult: Initial assessment;Term   Maternal Data Has patient been taught Hand Expression?: Yes Does the patient have breastfeeding experience prior to this delivery?: Yes  Feeding Feeding Type: Breast Fed  LATCH Score Latch: Grasps breast easily, tongue down, lips flanged, rhythmical sucking.  Audible Swallowing: None  Type of Nipple: Everted at rest and after stimulation  Comfort (Breast/Nipple): Soft / non-tender  Hold (Positioning): No assistance needed to correctly position infant at breast.  LATCH Score: 8  Interventions Interventions: Breast feeding basics reviewed;Skin to skin  Lactation Tools Discussed/Used WIC Program: Yes   Consult Status Consult Status: Follow-up Date: 10/14/19 Follow-up type: In-patient    [name], Diamond Nickel 10/13/2019, 3:15 AM

## 2019-10-13 NOTE — Progress Notes (Signed)
At bedside to discuss + yeast, + Bacterial Vaginosis from clinic swab. Patient desires inpatient treatment. Medications ordered. RN notified of new order via Vocera phone.  Clayton Bibles, MSN, CNM Certified Nurse Midwife, [employer] for [employer], [facility] 10/13/19 11:45 PM

## 2019-10-14 MED ORDER — METRONIDAZOLE 500 MG PO TABS: 500.0000 mg | ORAL_TABLET | Freq: Two times a day (BID) | ORAL | 0 refills | Status: AC

## 2019-10-14 NOTE — Discharge Instructions (Signed)

## 2019-10-14 NOTE — Lactation Note (Signed)
This note was copied from a baby's chart. Lactation Consultation Note  Patient Name: Christine Hodge [id number] Date: 10/14/2019   Per Limmie Patricia, RN, Mom said she did not need to see a lactation consultant prior to discharge.   Mom is a P4.  Lurline Hare [facility] 10/14/2019, 10:33 AM

## 2019-10-15 ENCOUNTER — Inpatient Hospital Stay (HOSPITAL_COMMUNITY): Payer: Medicaid Other

## 2019-10-15 ENCOUNTER — Inpatient Hospital Stay (HOSPITAL_COMMUNITY)
Admission: AD | Admit: 2019-10-15 | Payer: Medicaid Other | Source: Home / Self Care | Admitting: Obstetrics & Gynecology

## 2019-11-13 ENCOUNTER — Encounter: Payer: Self-pay | Admitting: Family Medicine

## 2019-11-13 ENCOUNTER — Other Ambulatory Visit: Payer: Self-pay

## 2019-11-13 ENCOUNTER — Ambulatory Visit (HOSPITAL_BASED_OUTPATIENT_CLINIC_OR_DEPARTMENT_OTHER): Payer: Medicaid Other | Admitting: Family Medicine

## 2019-11-13 MED ORDER — NORETHINDRONE 0.35 MG PO TABS
1.0000 | ORAL_TABLET | Freq: Every day | ORAL | 3 refills | Status: DC
Start: 2019-11-13 — End: 2020-09-09

## 2019-11-13 MED ORDER — CYCLOBENZAPRINE HCL 10 MG PO TABS
10.0000 mg | ORAL_TABLET | Freq: Three times a day (TID) | ORAL | 2 refills | Status: DC | PRN
Start: 2019-11-13 — End: 2020-09-09

## 2019-11-13 NOTE — Progress Notes (Signed)
    Post Partum Visit Note  Christine Hodge is a 29 y.o. [phone] female who presents for a postpartum visit. She is 4 weeks postpartum following a normal spontaneous vaginal delivery.  I have fully reviewed the prenatal and intrapartum course. The delivery was at 40 gestational weeks.  Anesthesia: epidural. Postpartum course has been complicated by back pain. Baby is doing well. Baby is feeding by breast. Bleeding no bleeding. Bowel function is normal. Bladder function is normal. Patient is not sexually active. Contraception method is OCP (estrogen/progesterone). Postpartum depression screening: negative.  The following portions of the patient's history were reviewed and updated as appropriate: allergies, current medications, past family history, past medical history, past social history, past surgical history and problem list.  Review of Systems Pertinent items are noted in HPI.    Objective:  Blood pressure 109/74, pulse 71, weight 192 lb (87.1 kg), last menstrual period 11/05/2018, currently breastfeeding.  General:  alert, cooperative and no distress  Lungs: clear to auscultation bilaterally  Heart:  regular rate and rhythm, S1, S2 normal, no murmur, click, rub or gallop  Abdomen: soft, non-tender; bowel sounds normal; no masses,  no organomegaly        Assessment:    Normal postpartum exam. Pap smear not done at today's visit.   Plan:   Essential components of care per ACOG recommendations:  1.  Mood and well being: Patient with negative depression screening today. Reviewed local resources for support.  - Patient does not use tobacco.   2. Infant care and feeding:  -Patient currently breastmilk feeding? Yes  -Social determinants of health (SDOH) reviewed in EPIC. No concerns  3. Sexuality, contraception and birth spacing - Patient does not want a pregnancy in the next year.   - Reviewed forms of contraception in tiered fashion. Patient desired oral progesterone-only  contraceptive today.   - Discussed birth spacing of 18 months  4. Sleep and fatigue -Encouraged family/partner/community support of 4 hrs of uninterrupted sleep to help with mood and fatigue  5. Physical Recovery  - Discussed patients delivery and complications  - Patient has urinary incontinence? No  - Patient is safe to resume physical and sexual activity  6.  Health Maintenance - Last pap smear done 2020 and was normal with negative HPV.   7. Chronic Disease - PCP follow up  Levie Heritage, DO Center for [employer], [facility]

## 2020-02-23 DIAGNOSIS — R52 Pain, unspecified: Secondary | ICD-10-CM | POA: Diagnosis not present

## 2020-02-23 DIAGNOSIS — R58 Hemorrhage, not elsewhere classified: Secondary | ICD-10-CM | POA: Diagnosis not present

## 2020-02-23 DIAGNOSIS — R531 Weakness: Secondary | ICD-10-CM | POA: Diagnosis not present

## 2020-02-23 DIAGNOSIS — O039 Complete or unspecified spontaneous abortion without complication: Secondary | ICD-10-CM | POA: Diagnosis not present

## 2020-02-23 DIAGNOSIS — R110 Nausea: Secondary | ICD-10-CM | POA: Diagnosis not present

## 2020-02-23 DIAGNOSIS — R102 Pelvic and perineal pain: Secondary | ICD-10-CM | POA: Diagnosis not present

## 2020-02-23 DIAGNOSIS — Z3202 Encounter for pregnancy test, result negative: Secondary | ICD-10-CM | POA: Diagnosis not present

## 2020-02-23 DIAGNOSIS — N939 Abnormal uterine and vaginal bleeding, unspecified: Secondary | ICD-10-CM | POA: Diagnosis not present

## 2020-02-23 DIAGNOSIS — R1031 Right lower quadrant pain: Secondary | ICD-10-CM | POA: Diagnosis not present

## 2020-02-23 DIAGNOSIS — R1032 Left lower quadrant pain: Secondary | ICD-10-CM | POA: Diagnosis not present

## 2020-05-21 ENCOUNTER — Ambulatory Visit: Payer: Medicaid Other | Admitting: Family Medicine

## 2020-06-01 ENCOUNTER — Ambulatory Visit (INDEPENDENT_AMBULATORY_CARE_PROVIDER_SITE_OTHER): Payer: Medicaid Other

## 2020-06-01 ENCOUNTER — Other Ambulatory Visit: Payer: Self-pay

## 2020-06-01 ENCOUNTER — Other Ambulatory Visit (HOSPITAL_COMMUNITY)
Admission: RE | Admit: 2020-06-01 | Discharge: 2020-06-01 | Disposition: A | Discharge status: Home / Self Care | Payer: Medicaid Other | Source: Ambulatory Visit | Attending: Family Medicine | Admitting: Family Medicine

## 2020-06-01 VITALS — BP 103/76 | HR 80 | Wt 197.0 lb

## 2020-06-01 DIAGNOSIS — N898 Other specified noninflammatory disorders of vagina: Secondary | ICD-10-CM | POA: Insufficient documentation

## 2020-06-01 NOTE — Progress Notes (Signed)
Pt presents with vaginal discharge with odor x 2 weeks. Pt also requests to be tested for STI's. Self swab was sent to the lab.  Christine Hodge l Dougles Kimmey, CMA

## 2020-06-02 ENCOUNTER — Telehealth: Payer: Self-pay

## 2020-06-02 DIAGNOSIS — B9689 Other specified bacterial agents as the cause of diseases classified elsewhere: Secondary | ICD-10-CM

## 2020-06-02 LAB — CERVICOVAGINAL ANCILLARY ONLY
Bacterial Vaginitis (gardnerella): POSITIVE — AB
Candida Glabrata: NEGATIVE
Candida Vaginitis: NEGATIVE
Chlamydia: NEGATIVE
Comment: NEGATIVE
Comment: NEGATIVE
Comment: NEGATIVE
Comment: NEGATIVE
Comment: NEGATIVE
Comment: NORMAL
Neisseria Gonorrhea: NEGATIVE
Trichomonas: NEGATIVE

## 2020-06-02 MED ORDER — METRONIDAZOLE 500 MG PO TABS: 500.0000 mg | ORAL_TABLET | Freq: Two times a day (BID) | ORAL | 0 refills | Status: DC

## 2020-06-02 NOTE — Telephone Encounter (Signed)
Called pt to discuss positive BV results. Left message for pt to call the office back. Flagyl 500 mg BID x 7 days was sent to her pharmacy.  Laya Letendre l Amer Alcindor, CMA

## 2020-06-02 NOTE — Telephone Encounter (Signed)
Pt returned phone call to discuss positive BV results. Pt made aware that Flagyl 500 mg BID x 7 days was sent to her pharmacy. Pt also advised not to drink as the medication can make her sick. Understanding was voiced. Willam Munford l Alvine Mostafa, CMA

## 2020-06-24 ENCOUNTER — Ambulatory Visit: Payer: Medicaid Other | Admitting: Family Medicine

## 2020-06-24 ENCOUNTER — Other Ambulatory Visit: Payer: Self-pay

## 2020-06-24 ENCOUNTER — Encounter: Payer: Self-pay | Admitting: Family Medicine

## 2020-06-24 VITALS — BP 112/69 | HR 83 | Wt 193.0 lb

## 2020-06-24 DIAGNOSIS — N926 Irregular menstruation, unspecified: Secondary | ICD-10-CM

## 2020-06-24 DIAGNOSIS — Z3009 Encounter for other general counseling and advice on contraception: Secondary | ICD-10-CM | POA: Diagnosis not present

## 2020-06-24 LAB — POCT URINE PREGNANCY: Preg Test, Ur: NEGATIVE

## 2020-06-24 NOTE — Progress Notes (Signed)
Patient presents for irregular periods. Patient stopped taking birth control pills in August 2021. Patient states she has two periods a month. Armandina Stammer RN

## 2020-06-24 NOTE — Progress Notes (Signed)
   Subjective:    Patient ID: Christine Hodge, female    DOB: 01-06-1991, 30 y.o.   MRN: [id number]  HPI Patient is 8 months postpartum from SVD. Was placed on micronor, which she took for 2-3 months, then stopped. Since then, she's had moderate bleeding about every 2 weeks. Will bleed for 3-6 days. Seems like its spacing out a little this month - is about 3-4 days later than normal.  Denies weakness, dizziness, lightheadedness, fatigue.   Review of Systems     Objective:   Physical Exam Vitals reviewed.  Constitutional:      Appearance: Normal appearance.  Cardiovascular:     Rate and Rhythm: Normal rate and regular rhythm.     Pulses: Normal pulses.     Heart sounds: Normal heart sounds.  Pulmonary:     Effort: Pulmonary effort is normal. No respiratory distress.     Breath sounds: Normal breath sounds. No stridor. No wheezing or rhonchi.  Skin:    Capillary Refill: Capillary refill takes less than 2 seconds.  Neurological:     General: No focal deficit present.     Mental Status: She is alert and oriented to person, place, and time.  Psychiatric:        Mood and Affect: Mood normal.        Behavior: Behavior normal.        Thought Content: Thought content normal.        Judgment: Judgment normal.        Assessment & Plan:  1. Irregular periods Discussed starting OCPs vs watching. Would like to watch menses for a couple of months, then reassess. Continue to tract menses on APP.  - POCT urine pregnancy

## 2020-08-26 ENCOUNTER — Ambulatory Visit: Payer: Medicaid Other | Admitting: Family Medicine

## 2020-09-09 ENCOUNTER — Other Ambulatory Visit: Payer: Self-pay

## 2020-09-09 ENCOUNTER — Encounter: Payer: Self-pay | Admitting: Family Medicine

## 2020-09-09 ENCOUNTER — Ambulatory Visit: Payer: Medicaid Other | Admitting: Family Medicine

## 2020-09-09 ENCOUNTER — Other Ambulatory Visit (HOSPITAL_BASED_OUTPATIENT_CLINIC_OR_DEPARTMENT_OTHER): Payer: Self-pay

## 2020-09-09 VITALS — BP 103/68 | HR 81 | Wt 196.0 lb

## 2020-09-09 DIAGNOSIS — M545 Low back pain, unspecified: Secondary | ICD-10-CM | POA: Diagnosis not present

## 2020-09-09 DIAGNOSIS — Z30013 Encounter for initial prescription of injectable contraceptive: Secondary | ICD-10-CM | POA: Diagnosis not present

## 2020-09-09 LAB — POCT URINE PREGNANCY: Preg Test, Ur: NEGATIVE

## 2020-09-09 MED ORDER — BACLOFEN 10 MG PO TABS
10.0000 mg | ORAL_TABLET | Freq: Three times a day (TID) | ORAL | 0 refills | Status: AC
Start: 2020-09-09 — End: ?
  Filled 2020-09-09: qty 30, 10d supply, fill #0

## 2020-09-09 MED ORDER — MEDROXYPROGESTERONE ACETATE 150 MG/ML IM SUSP
150.0000 mg | Freq: Once | INTRAMUSCULAR | Status: AC
  Administered 2020-09-09: 150 mg via INTRAMUSCULAR

## 2020-09-09 NOTE — Progress Notes (Signed)
   Subjective:    Patient ID: Christine Hodge, female    DOB: [birth year], 30 y.o.   MRN: [id number]  HPI Having pain occasionally in back since having her daughter 11 months ago. Pain on the left upper lumbar spine "where she had the epidural". Pain lasts for 1.5 hours. Improves with ice/heat. No provoking factors. Has had this 4-5 times since delivery.  Wants to restart depo.   Review of Systems     Objective:   Physical Exam Vitals and nursing note reviewed.  Constitutional:      Appearance: Normal appearance.  Cardiovascular:     Rate and Rhythm: Normal rate and regular rhythm.  Musculoskeletal:       Arms:  Skin:    General: Skin is warm and dry.     Capillary Refill: Capillary refill takes less than 2 seconds.  Neurological:     General: No focal deficit present.     Mental Status: She is alert.  Psychiatric:        Mood and Affect: Mood normal.        Behavior: Behavior normal.        Thought Content: Thought content normal.        Judgment: Judgment normal.       Assessment & Plan:  1. Initiation of Depo Provera Start depo. Has used it before without side effects. - POCT urine pregnancy  2. Lumbar pain Intermittent. As not consistent, would not represent cord or nerve injury. Recommended baclofen prn.

## 2020-11-29 ENCOUNTER — Other Ambulatory Visit: Payer: Self-pay

## 2020-11-29 ENCOUNTER — Ambulatory Visit: Payer: Medicaid Other

## 2020-12-23 ENCOUNTER — Ambulatory Visit: Payer: Medicaid Other | Admitting: Family Medicine

## 2021-02-14 IMAGING — US US MFM OB COMP +14 WKS
1 series · 14 of 28 positions shown · non-contrast
Comparison: none

[Series 1: us mfm ob comp +14 wks · 57 acquisitions, 14 frames shown]
[im 3/57]
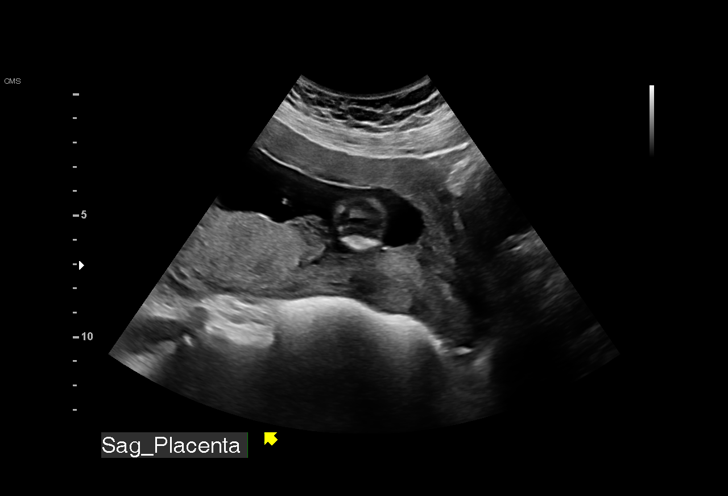
[im 7/57]
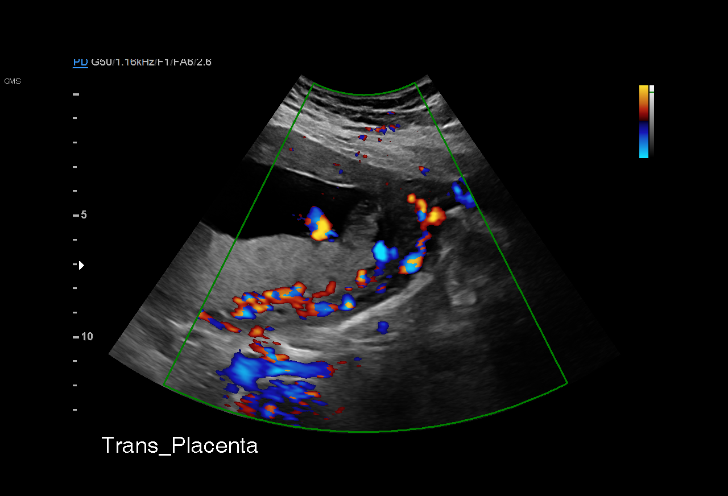
[im 11/57]
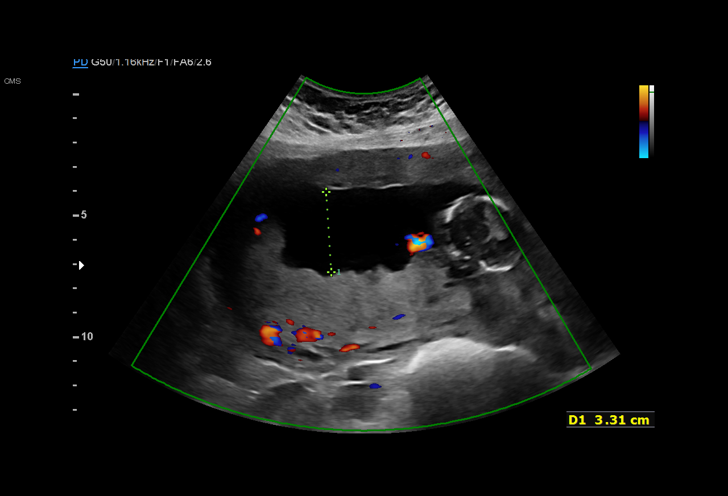
[im 15/57]
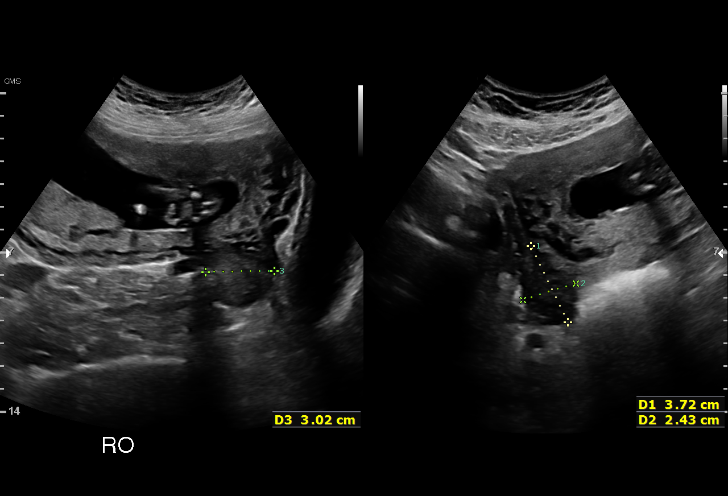
[im 19/57]
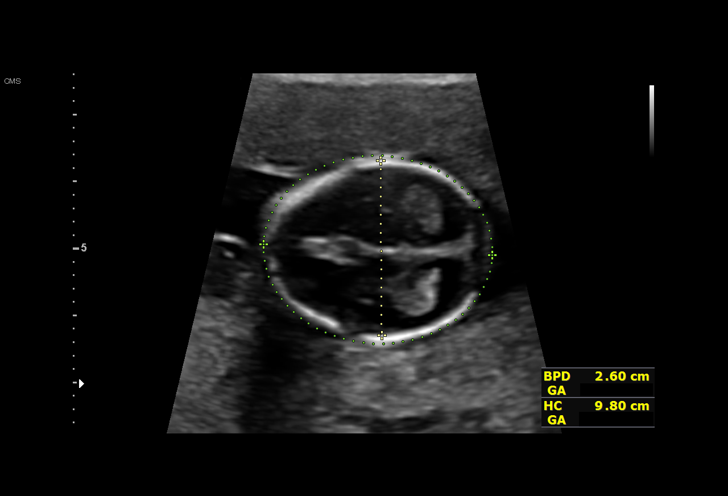
[im 23/57]
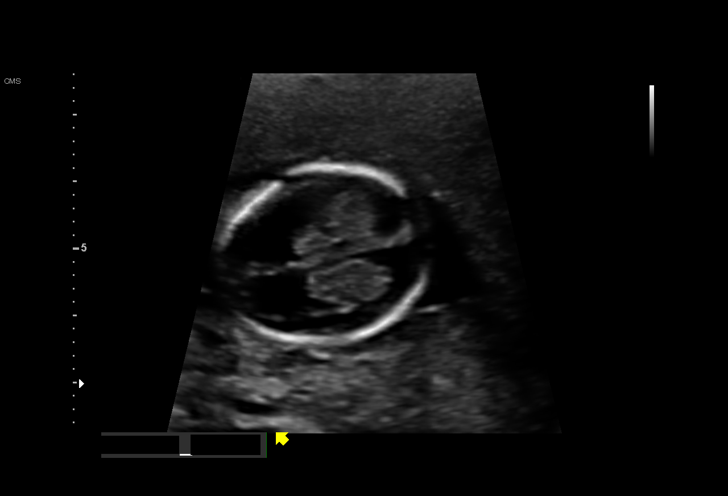
[im 27/57]
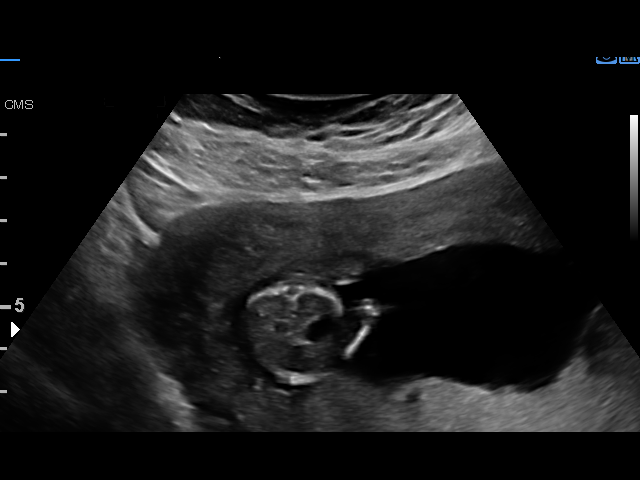
[im 32/57]
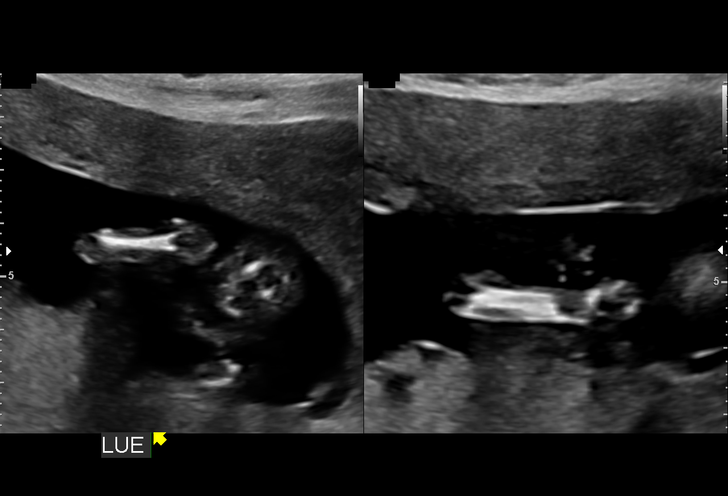
[im 36/57]
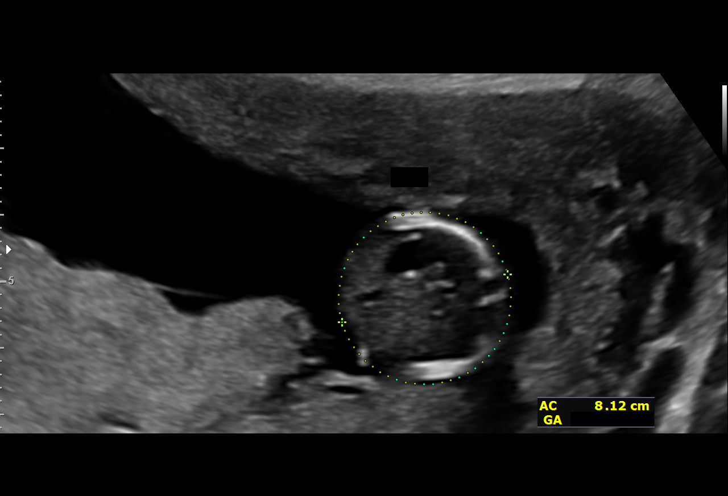
[im 40/57]
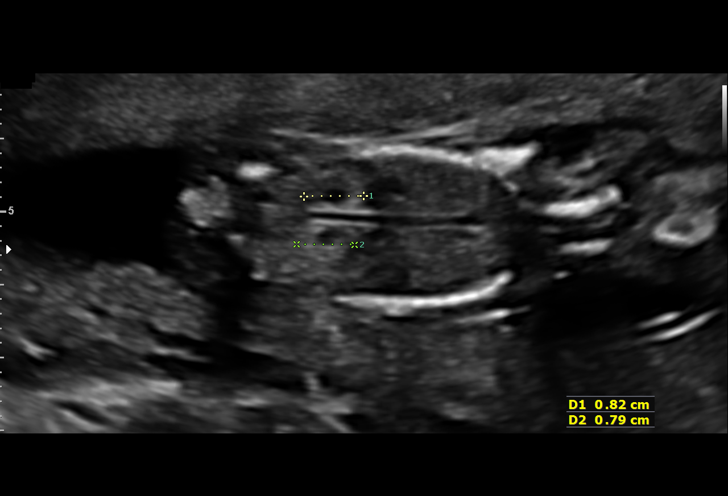
[im 44/57]
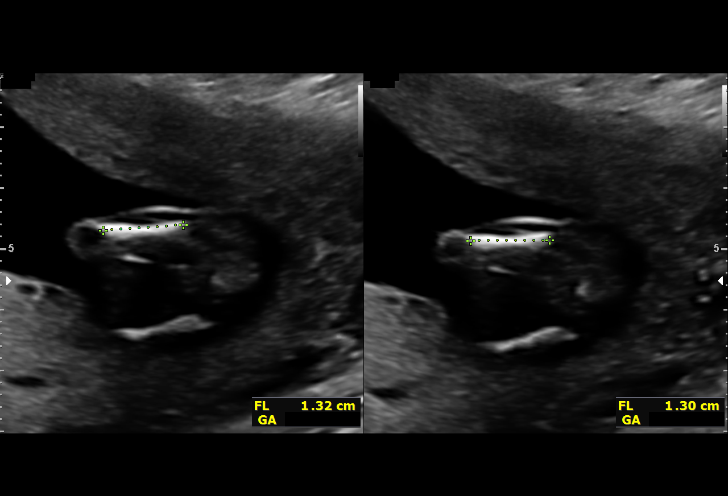
[im 48/57]
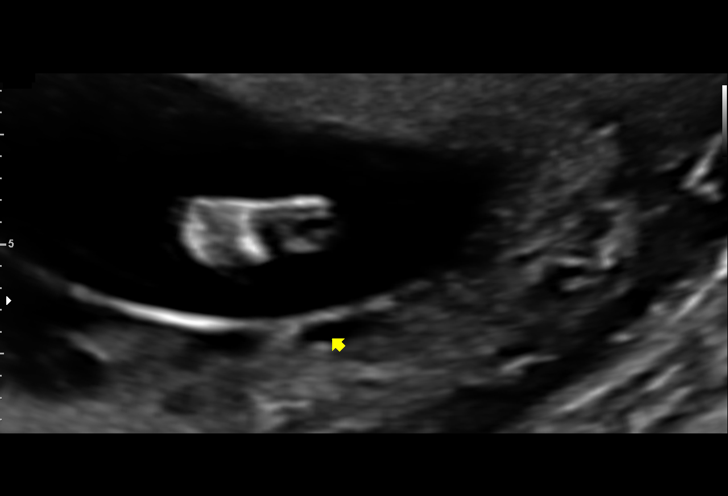
[im 52/57]
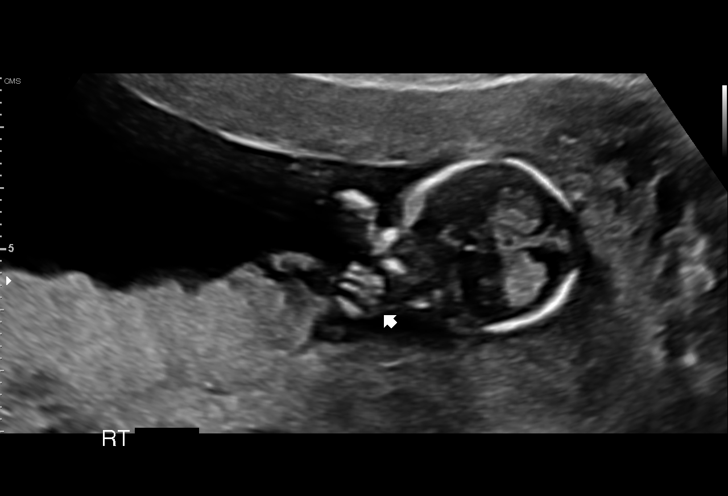
[im 57/57]
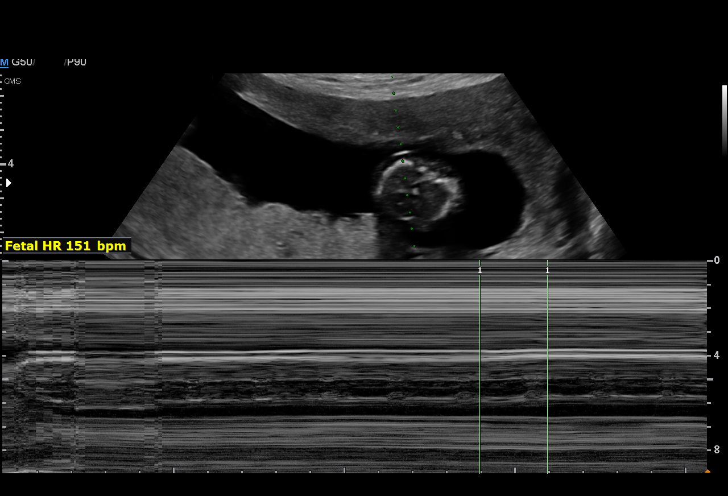

[14 of 28 positions shown; findings below may reference images not displayed]

1  US MFM OB COMP + 14 WK                76805.01     GLENDY DELMAR
 ----------------------------------------------------------------------

 ----------------------------------------------------------------------
Indications

  Size of fetus inconsistent with dates in
  second trimester
  Encounter for uncertain dates
  Antenatal screening for malformations
  14 weeks gestation of pregnancy
 ----------------------------------------------------------------------
Fetal Evaluation

 Num Of Fetuses:          1
 Fetal Heart Rate(bpm):   151
 Cardiac Activity:        Observed
 Presentation:            Cephalic
 Placenta:                Posterior
 P. Cord Insertion:       Visualized

 Amniotic Fluid
 AFI FV:      Within normal limits

                             Largest Pocket(cm)

Biometry

 BPD:        26   mm     G. Age:  14w 4d                  CI:         71.26  %    70 - 86
                                                          FL/HC:       13.3  %
 HC:       98.1   mm     G. Age:  14w 4d                  HC/AC:       1.24       1.14 -
 AC:       79.3   mm     G. Age:  14w 2d                  FL/BPD:      50.0  %
 FL:         13   mm     G. Age:  13w 6d                  FL/AC:       16.4  %    20 - 24
 Est. FW:       91  gm      0 lb 3 oz
OB History

 Gravidity:     4         Term:  3          Prem:  0        SAB:   0
 TOP:           0       Ectopic: 0         Living: 3
Gestational Age

 U/S Today:      14w 2d                                         EDD:  10/08/19
 Best:           14w 2d    Det. By:  U/S (04/11/19)             EDD:  10/08/19
Anatomy

 Cranium:                Visualized             Kidneys:                Visualized
 Cavum:                  Visualized             Bladder:                Visualized
 Choroid Plexus:         Visualized             Spine:                  Visualized
 Face:                   Visualized             Upper Extremities:      Visualized
 Stomach:                Appears normal, left   Lower Extremities:      Visualized
                         sided
Cervix Uterus Adnexa

 Cervix
 Closed

 Uterus
 No abnormality visualized.

 Left Ovary
 Within normal limits.

 Right Ovary
 Within normal limits.

 Cul De Sac
 No free fluid seen.

 Adnexa
 No abnormality visualized.
Comments

 This patient had an ultrasound performed to determine her due
 date.

 The fetal biometery measurements obtained today are
 consistent with an EDC of 10/08/2019.

 She will return in 5 weeks for a fetal anatomy scan.

## 2021-03-21 IMAGING — US US MFM OB FOLLOW-UP
1 series · 13 of 28 positions shown · non-contrast
Comparison: none

[Series 1: us mfm ob follow-up · 78 acquisitions, 13 frames shown]
[im 3/78]
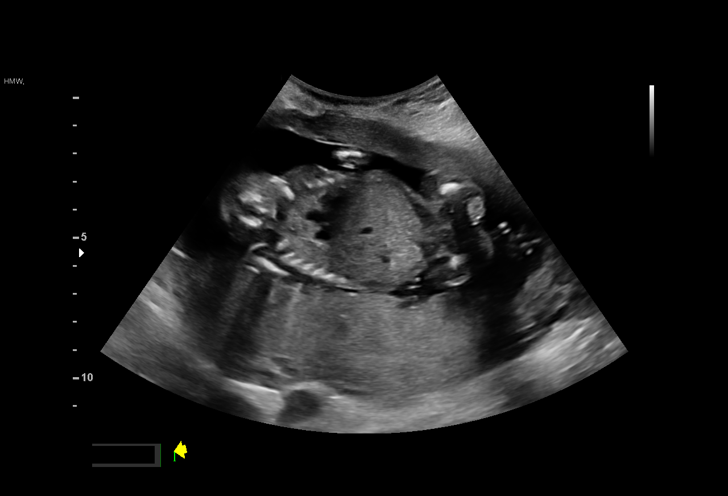
[im 9/78]
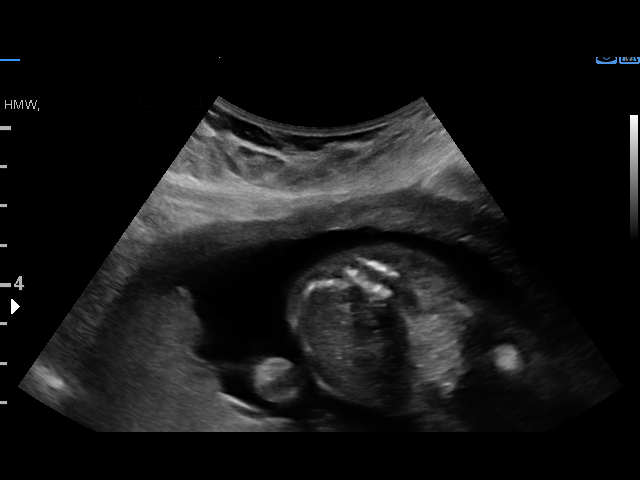
[im 15/78]
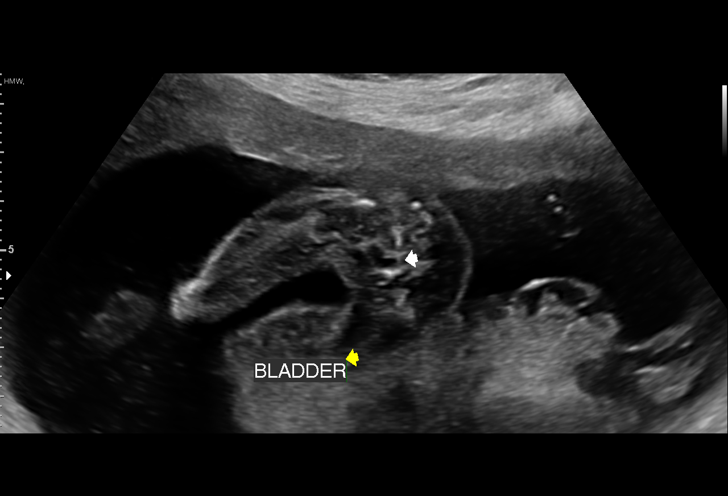
[im 20/78]
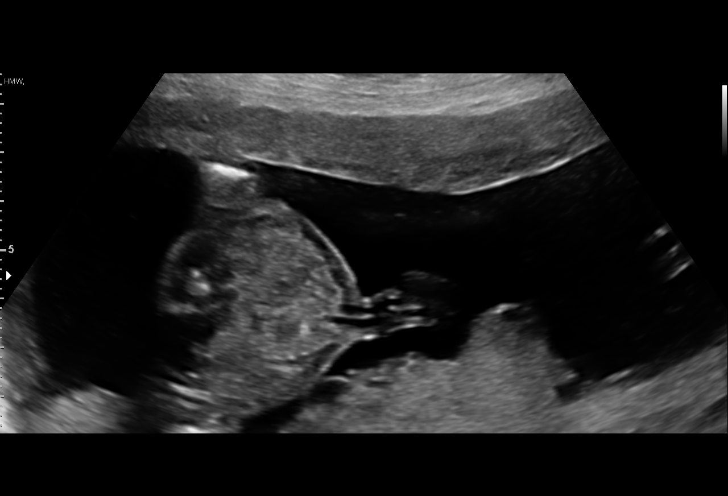
[im 26/78]
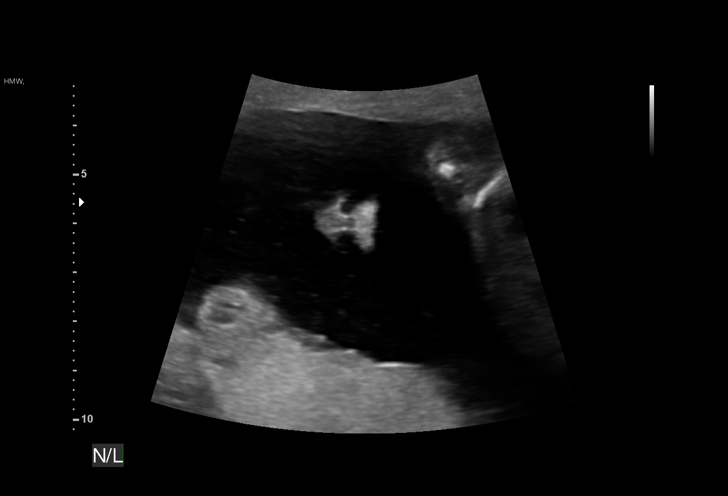
[im 32/78]
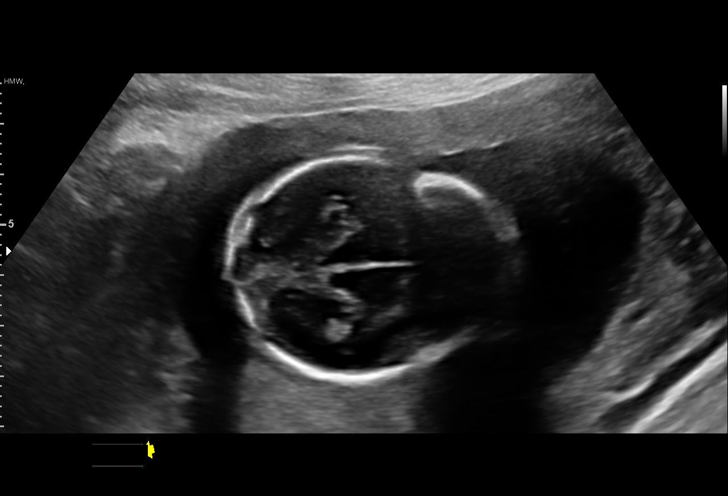
[im 40/78]
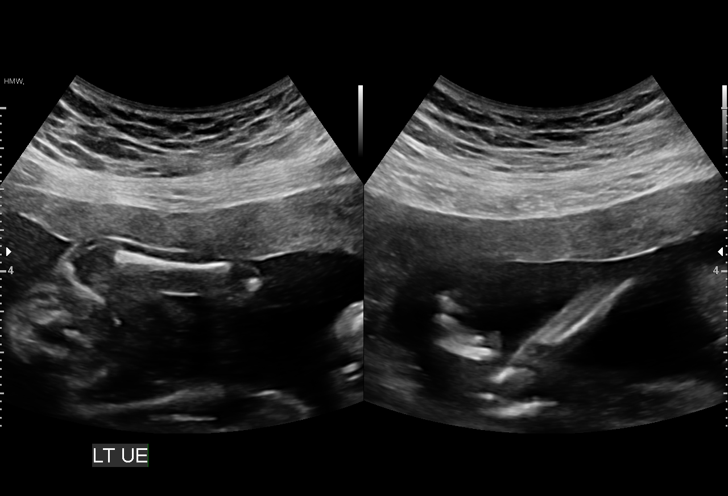
[im 46/78]
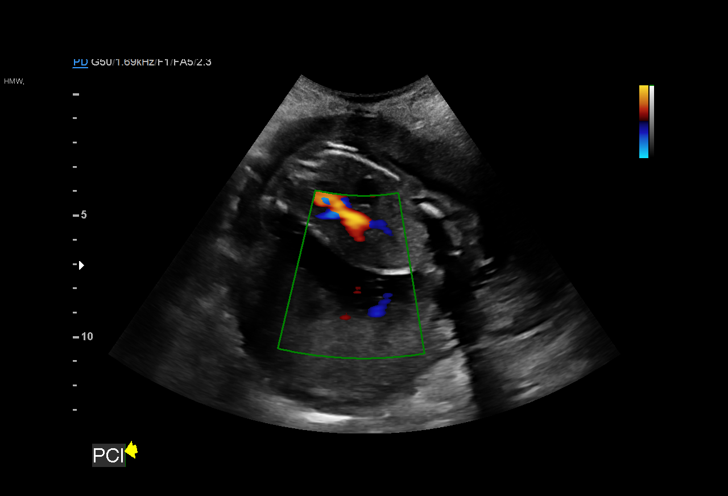
[im 52/78]
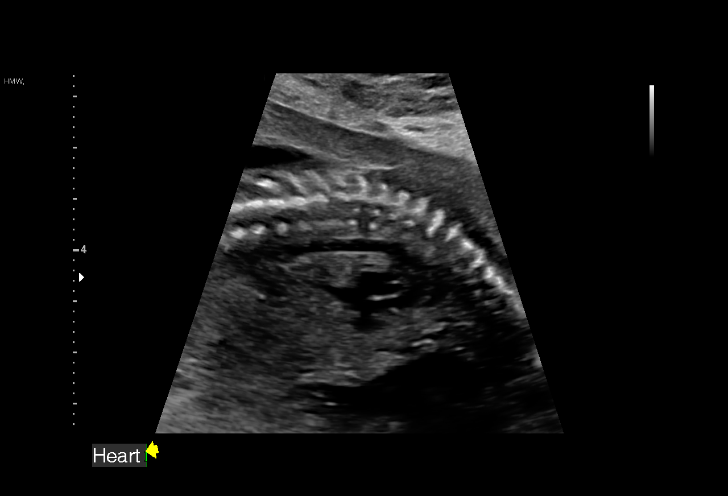
[im 58/78]
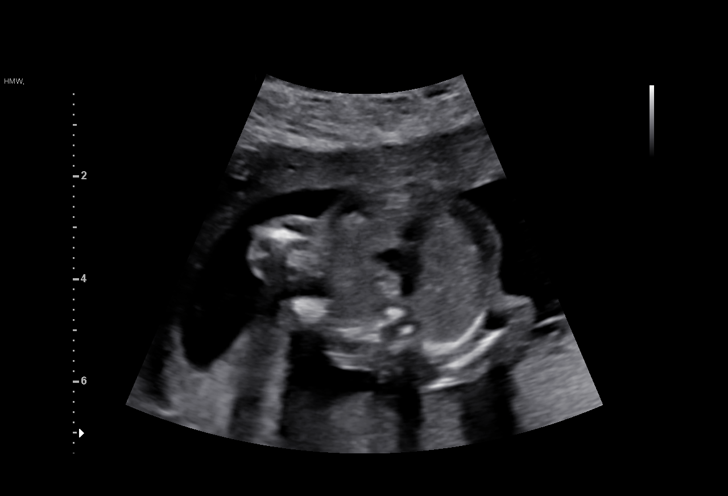
[im 63/78]
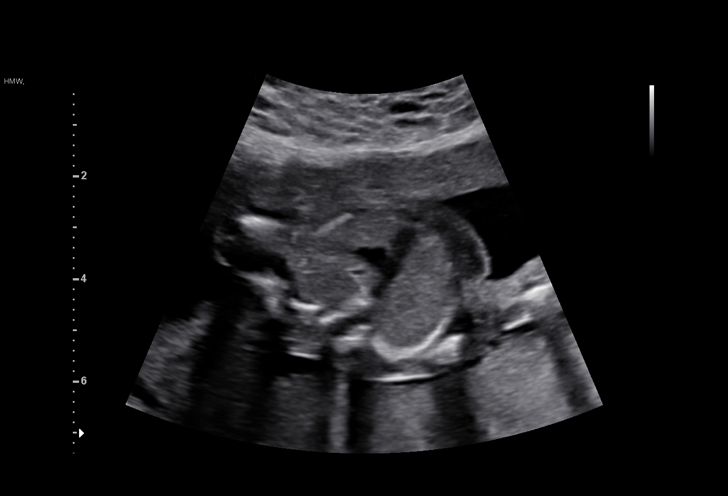
[im 69/78]
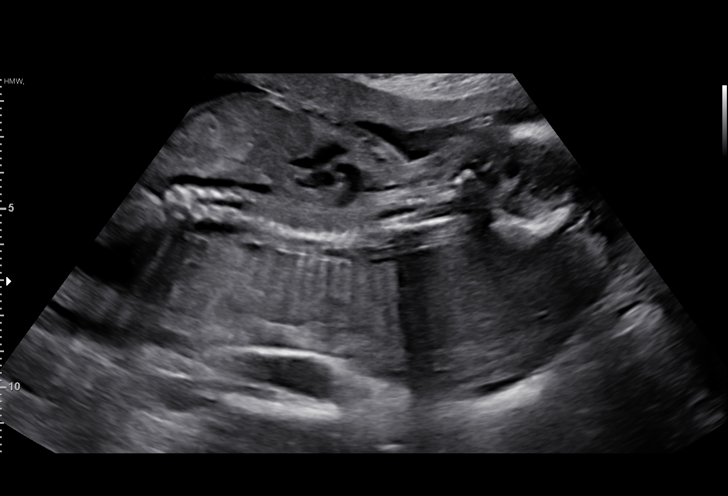
[im 75/78]
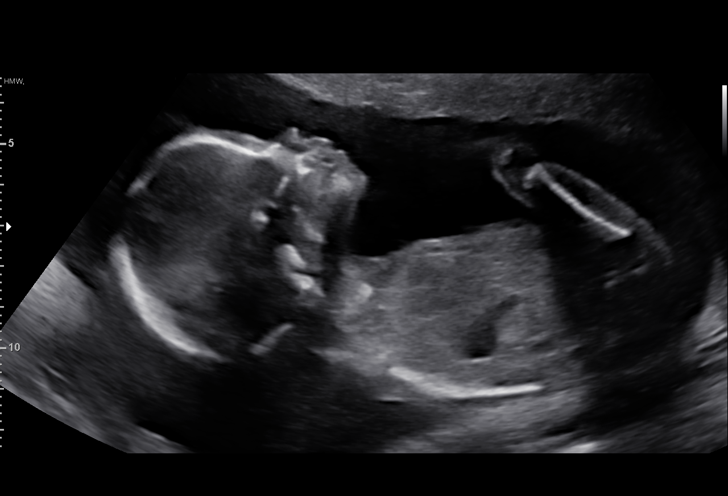

[13 of 28 positions shown; findings below may reference images not displayed]

----------------------------------------------------------------------

 ----------------------------------------------------------------------
Indications

  Fetal abnormality - other known or suspected
  Encounter for antenatal screening for
  malformations
  19 weeks gestation of pregnancy
 ----------------------------------------------------------------------
Fetal Evaluation

 Num Of Fetuses:         1
 Fetal Heart Rate(bpm):  126
 Cardiac Activity:       Observed
 Presentation:           Cephalic
 Placenta:               Posterior
 P. Cord Insertion:      Visualized, central

 Amniotic Fluid
 AFI FV:      Within normal limits

                             Largest Pocket(cm)

Biometry

 BPD:      43.4  mm     G. Age:  19w 1d         43  %    CI:        69.27   %    70 - 86
                                                         FL/HC:      18.0   %    16.1 -
 HC:      166.5  mm     G. Age:  19w 2d         45  %    HC/AC:      1.16        1.09 -
 AC:      143.7  mm     G. Age:  19w 5d         60  %    FL/BPD:     69.1   %
 FL:         30  mm     G. Age:  19w 2d         42  %    FL/AC:      20.9   %    20 - 24
 HUM:      29.3  mm     G. Age:  19w 4d         58  %
 CER:      19.2  mm     G. Age:  18w 4d         32  %
 NFT:       3.7  mm
 CM:        3.4  mm
 Est. FW:     295  gm    0 lb 10 oz      57  %
OB History

 Gravidity:    4         Term:   3        Prem:   0        SAB:   0
 TOP:          0       Ectopic:  0        Living: 3
Gestational Age

 U/S Today:     19w 3d                                        EDD:   10/07/19
 Best:          19w 2d     Det. By:  U/S  (04/11/19)          EDD:   10/08/19
Anatomy

 Cranium:               Appears normal         Aortic Arch:            Not well visualized
 Cavum:                 Appears normal         Ductal Arch:            Not well visualized
 Ventricles:            Appears normal         Diaphragm:              Appears normal
 Choroid Plexus:        Appears normal         Stomach:                Appears normal, left
                                                                       sided
 Cerebellum:            Appears normal         Abdomen:                Appears normal
 Posterior Fossa:       Appears normal         Abdominal Wall:         Appears nml (cord
                                                                       insert, abd wall)
 Nuchal Fold:           Appears normal         Cord Vessels:           Appears normal (3
                                                                       vessel cord)
 Face:                  Orbits nl; profile not Kidneys:                Appear normal
                        well visualized
 Lips:                  Appears normal         Bladder:                Appears normal
 Thoracic:              Appears normal         Spine:                  Appears normal
 Heart:                 Not well visualized    Upper Extremities:      Appears normal
 RVOT:                  Not well visualized    Lower Extremities:      Appears normal
 LVOT:                  Not well visualized

 Other:  Heels and 5th digit visualized. Technically difficult due to fetal position.
Cervix Uterus Adnexa

 Cervix
 Length:              5  cm.
 Normal appearance by transabdominal scan.

 Uterus
 No abnormality visualized.

 Left Ovary
 No adnexal mass visualized.

 Right Ovary
 No adnexal mass visualized.

 Cul De Sac
 No free fluid seen.

 Adnexa
 No abnormality visualized.
Comments

 This patient was seen for a detailed fetal anatomy scan. She
 denies any significant past medical history and denies any
 problems in her current pregnancy.
 The patient did not have any screening test for fetal
 aneuploidy performed in her current pregnancy.
 She was informed that the fetal growth and amniotic fluid
 level were appropriate for her gestational age.  Today's fetal
 biometry measurements are consistent with an EDC October 08, 2019.  This should be used as her final EDC.
 There were no obvious fetal anomalies noted on today's
 ultrasound exam.
 The patient was informed that anomalies may be missed due
 to technical limitations. If the fetus is in a suboptimal position
 or maternal habitus is increased, visualization of the fetus in
 the maternal uterus may be impaired.
 A follow-up exam was scheduled in 4 weeks to obtain better
 views of the fetal anatomy which were limited today due to
 the fetal position.
 All conversations were held with the patient today with the
 help of a Portuguese interpreter.

## 2021-06-16 IMAGING — US US MFM OB FOLLOW-UP
1 series · 14 of 28 positions shown · non-contrast
Comparison: none

[Series 1: us mfm ob follow-up · 71 acquisitions, 14 frames shown]
[im 3/71]
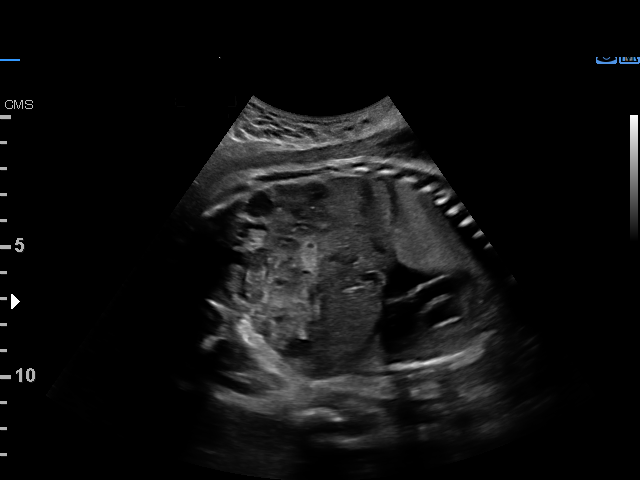
[im 8/71]
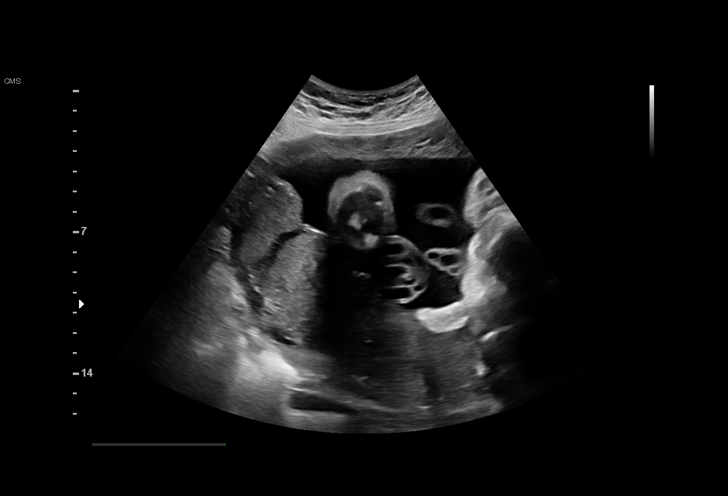
[im 13/71]
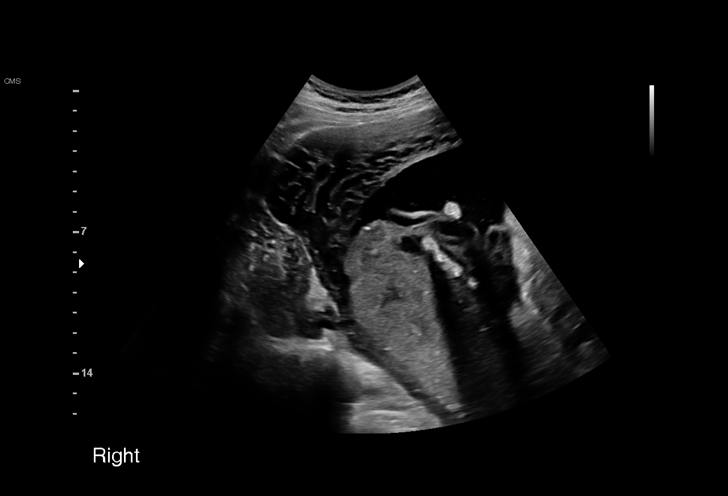
[im 19/71]
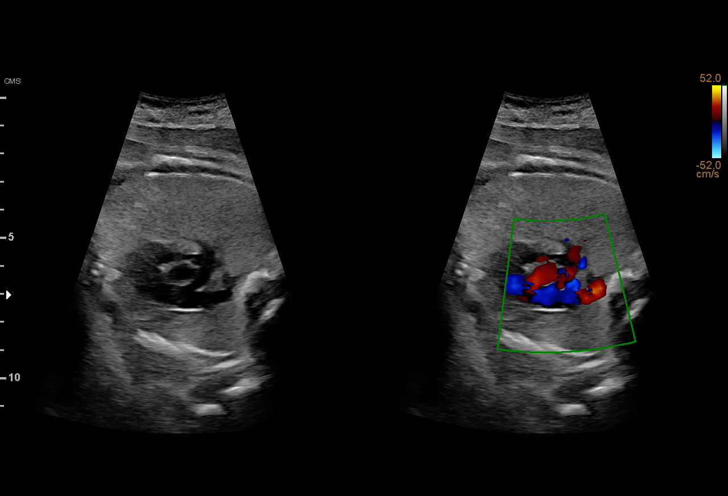
[im 24/71]
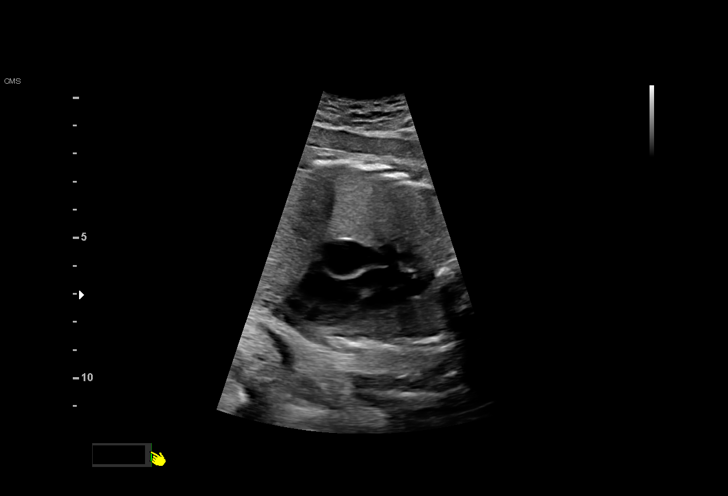
[im 29/71]
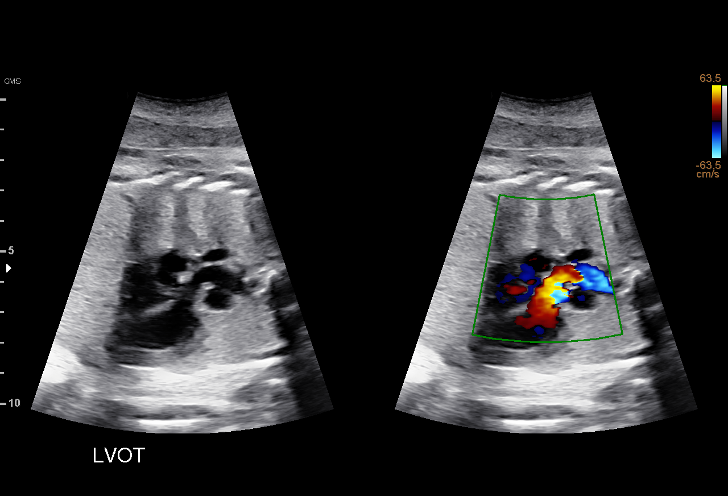
[im 34/71]
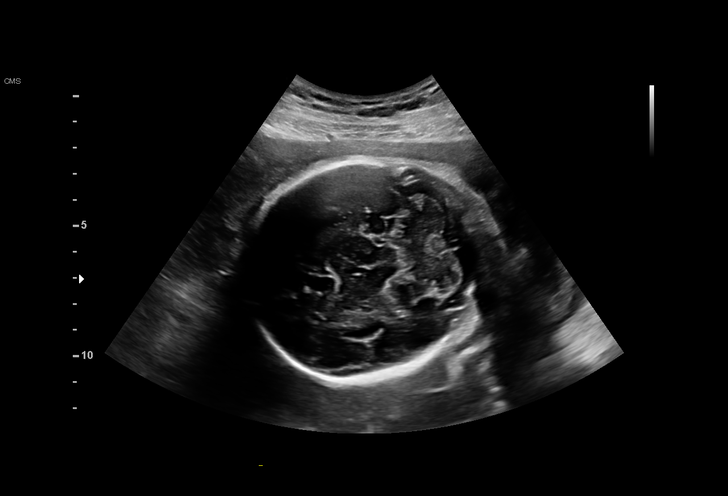
[im 39/71]
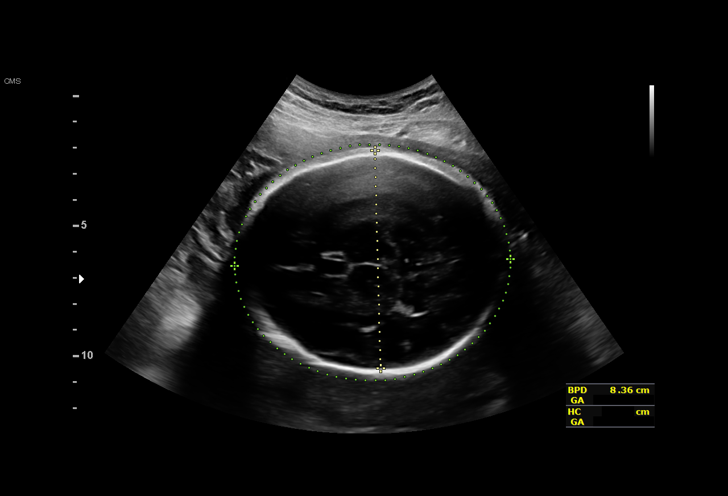
[im 45/71]
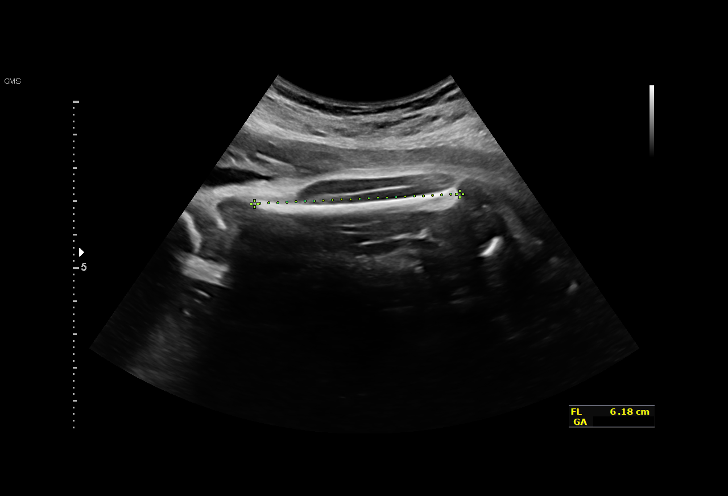
[im 50/71]
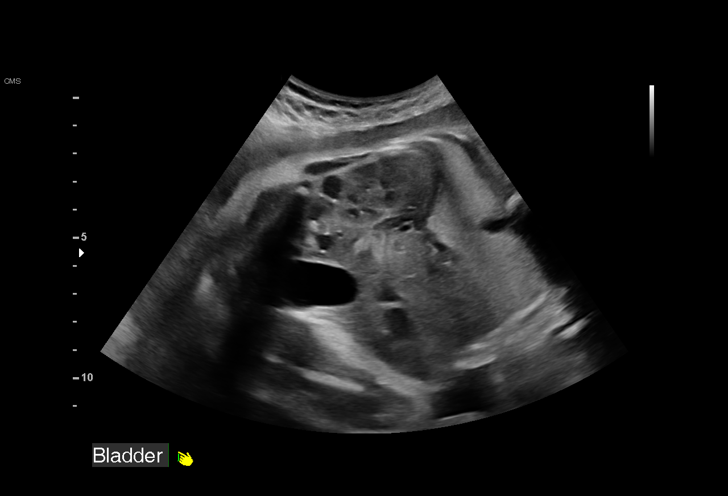
[im 55/71]
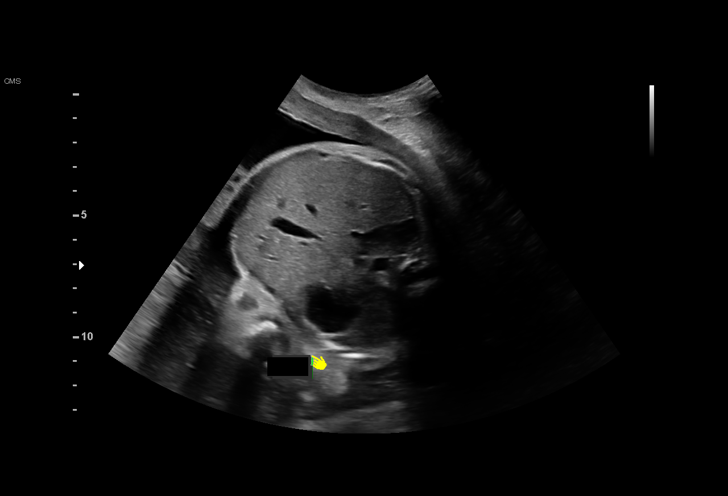
[im 60/71]
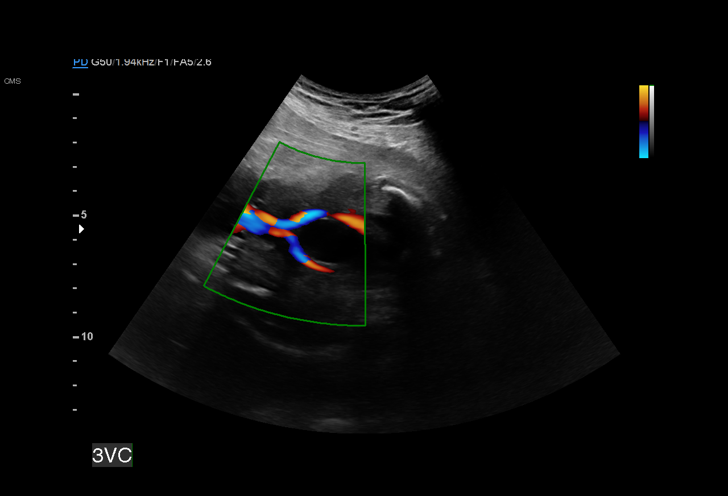
[im 65/71]
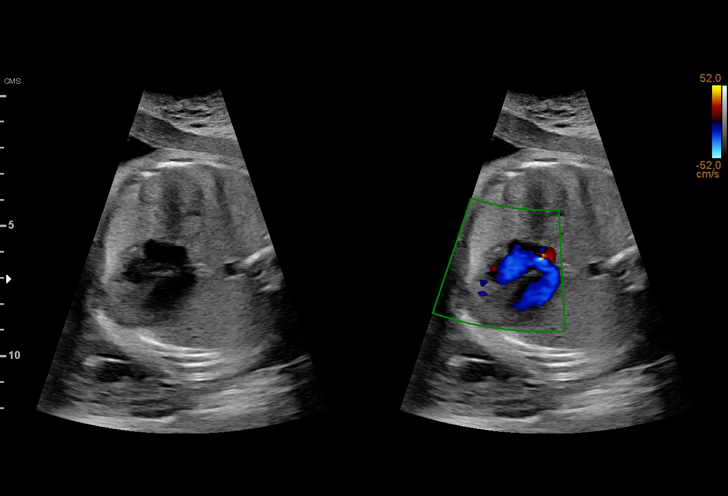
[im 71/71]
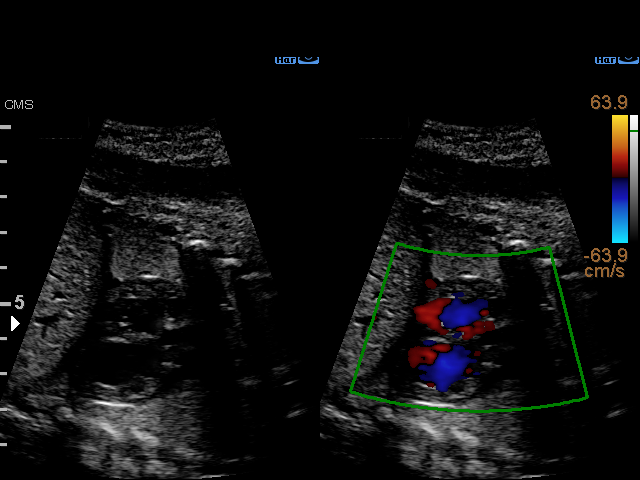

[14 of 28 positions shown; findings below may reference images not displayed]

----------------------------------------------------------------------

 ----------------------------------------------------------------------
Indications

  Antenatal follow-up for nonvisualized fetal
  anatomy
  31 weeks gestation of pregnancy
 ----------------------------------------------------------------------
Fetal Evaluation

 Num Of Fetuses:         1
 Cardiac Activity:       Observed
 Presentation:           Cephalic
 Placenta:               Posterior
 P. Cord Insertion:      Previously Visualized

 AFI Sum(cm)     %Tile       Largest Pocket(cm)
 12.95           39

 RUQ(cm)       RLQ(cm)       LUQ(cm)        LLQ(cm)

Biometry

 BPD:      84.1  mm     G. Age:  33w 6d         93  %    CI:        72.84   %    70 - 86
                                                         FL/HC:      20.0   %    19.1 -
 HC:      313.3  mm     G. Age:  35w 1d         93  %    HC/AC:      1.06        0.96 -
 AC:      296.3  mm     G. Age:  33w 4d         92  %    FL/BPD:     74.7   %    71 - 87
 FL:       62.8  mm     G. Age:  32w 4d         59  %    FL/AC:      21.2   %    20 - 24
 HUM:      56.4  mm     G. Age:  32w 6d         72  %

 Est. FW:    2222  gm    4 lb 14 oz      90  %
OB History
 Gravidity:    4         Term:   3        Prem:   0        SAB:   0
 TOP:          0       Ectopic:  0        Living: 3
Gestational Age

 U/S Today:     33w 6d                                        EDD:   09/23/19
 Best:          31w 5d     Det. By:  U/S  (04/11/19)          EDD:   10/08/19
Anatomy

 Cranium:               Appears normal         Aortic Arch:            Appears normal
 Cavum:                 Appears normal         Ductal Arch:            Not well visualized
 Ventricles:            Appears normal         Diaphragm:              Appears normal
 Choroid Plexus:        Appears normal         Stomach:                Appears normal, left
                                                                       sided
 Cerebellum:            Appears normal         Abdomen:                Appears normal
 Posterior Fossa:       Appears normal         Abdominal Wall:         Previously seen
 Nuchal Fold:           Previously seen        Cord Vessels:           Appears normal (3
                                                                       vessel cord)
 Face:                  Profile nl; orbits     Kidneys:                Appear normal
                        prev visualized
 Lips:                  Appears normal         Bladder:                Appears normal
 Thoracic:              Appears normal         Spine:                  Previously seen
 Heart:                 Appears normal         Upper Extremities:      Previously seen
                        (4CH, axis, and
                        situs)
 RVOT:                  Appears normal         Lower Extremities:      Previously seen
 LVOT:                  Appears normal

 Other:  Heels and 5th digit previously visualized. Technically difficult due to
         fetal position.
Cervix Uterus Adnexa

 Cervix
 Not visualized (advanced GA >46wks)

 Uterus
 No abnormality visualized.

 Left Ovary
 Not visualized.

 Right Ovary
 Not visualized.

 Cul De Sac
 No free fluid seen.

 Adnexa
 No abnormality visualized.
Comments

 This patient was seen for a follow up exam as the views of
 the fetal anatomy were unable to be fully visualized during
 her prior exam.  The patient was initially scheduled to return
 in 4 weeks.  However, she did not return until today.  She
 denies any problems since her last exam.
 She was informed that the fetal growth and amniotic fluid
 level appears appropriate for her gestational age.
 The views of the fetal anatomy were visualized.  There were
 no obvious anomalies suspected.  However, today's exam
 was limited due to her advanced gestational age.
 Follow-up as indicated.

## 2022-10-02 ENCOUNTER — Telehealth: Payer: Self-pay

## 2022-10-02 NOTE — Telephone Encounter (Signed)
LVM for patient to schedule apt. AS, CMA

## 2023-10-10 DIAGNOSIS — Z7251 High risk heterosexual behavior: Secondary | ICD-10-CM | POA: Diagnosis not present

## 2023-10-10 DIAGNOSIS — Z1159 Encounter for screening for other viral diseases: Secondary | ICD-10-CM | POA: Diagnosis not present

## 2023-10-10 DIAGNOSIS — R5383 Other fatigue: Secondary | ICD-10-CM | POA: Diagnosis not present

## 2023-10-10 DIAGNOSIS — Z0000 Encounter for general adult medical examination without abnormal findings: Secondary | ICD-10-CM | POA: Diagnosis not present

## 2023-10-10 DIAGNOSIS — E559 Vitamin D deficiency, unspecified: Secondary | ICD-10-CM | POA: Diagnosis not present

## 2023-10-24 DIAGNOSIS — R87620 Atypical squamous cells of undetermined significance on cytologic smear of vagina (ASC-US): Secondary | ICD-10-CM | POA: Diagnosis not present

## 2023-10-24 DIAGNOSIS — B3731 Acute candidiasis of vulva and vagina: Secondary | ICD-10-CM | POA: Diagnosis not present

## 2023-10-24 DIAGNOSIS — R87811 Vaginal high risk human papillomavirus (HPV) DNA test positive: Secondary | ICD-10-CM | POA: Diagnosis not present

## 2023-11-06 DIAGNOSIS — E6609 Other obesity due to excess calories: Secondary | ICD-10-CM | POA: Diagnosis not present

## 2023-11-06 DIAGNOSIS — Z6829 Body mass index (BMI) 29.0-29.9, adult: Secondary | ICD-10-CM | POA: Diagnosis not present

## 2023-11-06 DIAGNOSIS — Z3200 Encounter for pregnancy test, result unknown: Secondary | ICD-10-CM | POA: Diagnosis not present

## 2023-11-06 DIAGNOSIS — R0602 Shortness of breath: Secondary | ICD-10-CM | POA: Diagnosis not present

## 2023-12-10 DIAGNOSIS — E6609 Other obesity due to excess calories: Secondary | ICD-10-CM | POA: Diagnosis not present

## 2023-12-10 DIAGNOSIS — Z3200 Encounter for pregnancy test, result unknown: Secondary | ICD-10-CM | POA: Diagnosis not present

## 2023-12-10 DIAGNOSIS — Z6829 Body mass index (BMI) 29.0-29.9, adult: Secondary | ICD-10-CM | POA: Diagnosis not present

## 2023-12-20 ENCOUNTER — Ambulatory Visit: Admitting: Obstetrics and Gynecology

## 2024-01-08 DIAGNOSIS — Z6829 Body mass index (BMI) 29.0-29.9, adult: Secondary | ICD-10-CM | POA: Diagnosis not present

## 2024-01-08 DIAGNOSIS — N926 Irregular menstruation, unspecified: Secondary | ICD-10-CM | POA: Diagnosis not present

## 2024-01-08 DIAGNOSIS — Z3200 Encounter for pregnancy test, result unknown: Secondary | ICD-10-CM | POA: Diagnosis not present

## 2024-01-08 DIAGNOSIS — E6609 Other obesity due to excess calories: Secondary | ICD-10-CM | POA: Diagnosis not present

## 2024-02-13 DIAGNOSIS — Z3200 Encounter for pregnancy test, result unknown: Secondary | ICD-10-CM | POA: Diagnosis not present

## 2024-02-13 DIAGNOSIS — E6609 Other obesity due to excess calories: Secondary | ICD-10-CM | POA: Diagnosis not present

## 2024-02-13 DIAGNOSIS — R0602 Shortness of breath: Secondary | ICD-10-CM | POA: Diagnosis not present

## 2024-02-13 DIAGNOSIS — Z6829 Body mass index (BMI) 29.0-29.9, adult: Secondary | ICD-10-CM | POA: Diagnosis not present

## 2024-03-04 ENCOUNTER — Encounter: Admitting: Obstetrics and Gynecology
